# Patient Record
Sex: Female | Born: 1975 | Race: White | Hispanic: Yes | Marital: Married | Smoking: Former smoker
Health system: Southern US, Community
[De-identification: ages and names within clinical notes are randomized; demographics above are authoritative.]

## PROBLEM LIST (undated history)

## (undated) HISTORY — PX: APPENDECTOMY: SHX54

---

## 2000-11-20 ENCOUNTER — Emergency Department (HOSPITAL_COMMUNITY): Admission: EM | Admit: 2000-11-20 | Discharge: 2000-11-20 | Payer: Self-pay | Admitting: Emergency Medicine

## 2002-11-21 ENCOUNTER — Ambulatory Visit (HOSPITAL_COMMUNITY): Admission: RE | Admit: 2002-11-21 | Discharge: 2002-11-21 | Payer: Self-pay | Admitting: Obstetrics and Gynecology

## 2003-02-16 ENCOUNTER — Ambulatory Visit (HOSPITAL_COMMUNITY): Admission: AD | Admit: 2003-02-16 | Discharge: 2003-02-16 | Payer: Self-pay | Admitting: Obstetrics and Gynecology

## 2003-02-21 ENCOUNTER — Inpatient Hospital Stay (HOSPITAL_COMMUNITY): Admission: AD | Admit: 2003-02-21 | Discharge: 2003-02-22 | Payer: Self-pay | Admitting: Obstetrics and Gynecology

## 2005-03-11 ENCOUNTER — Emergency Department (HOSPITAL_COMMUNITY): Admission: EM | Admit: 2005-03-11 | Discharge: 2005-03-11 | Payer: Self-pay | Admitting: Emergency Medicine

## 2005-12-17 ENCOUNTER — Inpatient Hospital Stay (HOSPITAL_COMMUNITY): Admission: AD | Admit: 2005-12-17 | Discharge: 2005-12-19 | Payer: Self-pay | Admitting: Obstetrics and Gynecology

## 2012-09-25 ENCOUNTER — Encounter (HOSPITAL_COMMUNITY): Payer: Self-pay | Admitting: Emergency Medicine

## 2012-09-25 ENCOUNTER — Emergency Department (HOSPITAL_COMMUNITY)
Admission: EM | Admit: 2012-09-25 | Discharge: 2012-09-26 | Disposition: A | Discharge status: Home / Self Care | Payer: Self-pay | Attending: Emergency Medicine | Admitting: Emergency Medicine

## 2012-09-25 DIAGNOSIS — R42 Dizziness and giddiness: Secondary | ICD-10-CM | POA: Insufficient documentation

## 2012-09-25 DIAGNOSIS — R05 Cough: Secondary | ICD-10-CM | POA: Insufficient documentation

## 2012-09-25 DIAGNOSIS — R0602 Shortness of breath: Secondary | ICD-10-CM | POA: Insufficient documentation

## 2012-09-25 DIAGNOSIS — R059 Cough, unspecified: Secondary | ICD-10-CM | POA: Insufficient documentation

## 2012-09-25 DIAGNOSIS — Z87891 Personal history of nicotine dependence: Secondary | ICD-10-CM | POA: Insufficient documentation

## 2012-09-25 DIAGNOSIS — R5381 Other malaise: Secondary | ICD-10-CM | POA: Insufficient documentation

## 2012-09-25 NOTE — ED Notes (Signed)
Patient c/o productive cough with green phlegm x 1 month.  States has been feeling weak x 2 days.

## 2012-09-26 ENCOUNTER — Emergency Department (HOSPITAL_COMMUNITY): Payer: Self-pay

## 2012-09-26 MED ORDER — ALBUTEROL SULFATE HFA 108 (90 BASE) MCG/ACT IN AERS: 1.0000 | INHALATION_SPRAY | Freq: Four times a day (QID) | RESPIRATORY_TRACT | Status: DC | PRN

## 2012-09-26 MED ORDER — GUAIFENESIN-CODEINE 100-10 MG/5ML PO SOLN
5.0000 mL | Freq: Once | ORAL | Status: AC
  Administered 2012-09-26: 5 mL via ORAL
  Filled 2012-09-26: qty 5

## 2012-09-26 MED ORDER — ALBUTEROL SULFATE (5 MG/ML) 0.5% IN NEBU
5.0000 mg | INHALATION_SOLUTION | Freq: Once | RESPIRATORY_TRACT | Status: AC
  Administered 2012-09-26: 5 mg via RESPIRATORY_TRACT
  Filled 2012-09-26: qty 1

## 2012-09-26 MED ORDER — METHYLPREDNISOLONE SODIUM SUCC 125 MG IJ SOLR
125.0000 mg | Freq: Once | INTRAMUSCULAR | Status: AC
  Administered 2012-09-26: 125 mg via INTRAMUSCULAR
  Filled 2012-09-26: qty 2

## 2012-09-26 MED ORDER — DEXTROMETHORPHAN HBR 15 MG/5ML PO SYRP: 10.0000 mL | ORAL_SOLUTION | Freq: Four times a day (QID) | ORAL | Status: DC | PRN

## 2012-09-26 MED ORDER — PREDNISONE 10 MG PO TABS: 10.0000 mg | ORAL_TABLET | Freq: Every day | ORAL | Status: DC

## 2012-09-26 NOTE — ED Notes (Signed)
Pt complains of shortness of breath and cough over several months. States over the last couple days the cough has gotten worse, she has been weak and dizzy. Also states she has not been able to eat or drink much lately. Lungs clear on initial assessment. 02 sats 98% on room air.

## 2012-09-26 NOTE — ED Notes (Signed)
Pt states she feels much better, states decreased SOB after breathing treatment and states no cough at this time. Lungs clear, NAD.

## 2012-09-26 NOTE — ED Provider Notes (Signed)
History     CSN: [id number]  Arrival date & time 09/25/12  2328   First MD Initiated Contact with Patient 09/25/12 2347      Chief Complaint  Patient presents with  . Weakness  . Cough  . Dizziness    (Consider location/radiation/quality/duration/timing/severity/associated sxs/prior treatment) HPI Rita Charles is a 37 y.o. female who presents to the Emergency Department complaining of a cough for one month that will not allow her to sleep. She is sore from coughing.   History reviewed. No pertinent past medical history.  History reviewed. No pertinent past surgical history.  No family history on file.  History  Substance Use Topics  . Smoking status: Former Games developer  . Smokeless tobacco: Not on file  . Alcohol Use: Yes    OB History   Grav Para Term Preterm Abortions TAB SAB Ect Mult Living                  Review of Systems  Constitutional: Negative for fever.       10 Systems reviewed and are negative for acute change except as noted in the HPI.  HENT: Negative for congestion.   Eyes: Negative for discharge and redness.  Respiratory: Positive for cough and shortness of breath. Negative for wheezing.   Cardiovascular: Negative for chest pain.  Gastrointestinal: Negative for vomiting and abdominal pain.  Musculoskeletal: Negative for back pain.  Skin: Negative for rash.  Neurological: Negative for syncope, numbness and headaches.  Psychiatric/Behavioral:       No behavior change.    Allergies  Review of patient's allergies indicates no known allergies.  Home Medications  No current outpatient prescriptions on file.  BP 138/88  Pulse 68  Temp(Src) 98.3 F (36.8 C) (Oral)  Resp 20  Ht 5\' 9"  (1.753 m)  Wt 150 lb (68.04 kg)  BMI 22.14 kg/m2  SpO2 98%  LMP 08/28/2012  Physical Exam  Nursing note and vitals reviewed. Constitutional: She is oriented to person, place, and time.  Awake, alert, nontoxic appearance.  HENT:  Head: Normocephalic and  atraumatic.  Right Ear: External ear normal.  Left Ear: External ear normal.  Mouth/Throat: Oropharynx is clear and moist.  Eyes: Right eye exhibits no discharge. Left eye exhibits no discharge.  Neck: Neck supple.  Pulmonary/Chest: Effort normal. She has no wheezes. She has no rales. She exhibits no tenderness.  Coarse coughing  Abdominal: Soft. Bowel sounds are normal. There is no tenderness. There is no rebound.  Musculoskeletal: Normal range of motion. She exhibits no tenderness.  Baseline ROM, no obvious new focal weakness.  Neurological: She is alert and oriented to person, place, and time.  Mental status and motor strength appears baseline for patient and situation.  Skin: No rash noted.  Psychiatric: She has a normal mood and affect.    ED Course  Procedures (including critical care time)  Dg Chest 2 View  09/26/2012  *RADIOLOGY REPORT*  Clinical Data: 37 year old female with intermittent cough and congestion times 6 weeks.  Weakness and dizziness.  CHEST - 2 VIEW  Comparison: None.  Findings: Low lung volumes on the frontal view.  Cardiac size at the upper limits of normal. Other mediastinal contours are within normal limits.  Visualized tracheal air column is within normal limits.  No pneumothorax, pulmonary edema, pleural effusion or confluent pulmonary opacity.  Increased interstitial markings on the frontal view likely due to crowding/atelectasis, not evident on the lateral. No osseous abnormality identified.  IMPRESSION: No acute cardiopulmonary  abnormality.   Original Report Authenticated By: Erskine Speed, M.D.      MDM  Patient with cough which is persistent. Given albuterol. Solumedrol and robitussin ac with relief. Chest xray does not show an acute process.  Pt feels improved after observation and/or treatment in ED.Pt stable in ED with no significant deterioration in condition.The patient appears reasonably screened and/or stabilized for discharge and I doubt any other  medical condition or other [facility] requiring further screening, evaluation, or treatment in the ED at this time prior to discharge.  MDM Reviewed: nursing note and vitals Interpretation: x-ray           Nicoletta Dress. Colon Branch, MD 09/26/12 [phone]

## 2018-03-25 ENCOUNTER — Emergency Department (HOSPITAL_COMMUNITY): Payer: Self-pay

## 2018-03-25 ENCOUNTER — Observation Stay (HOSPITAL_COMMUNITY)
Admission: EM | Admit: 2018-03-25 | Discharge: 2018-03-25 | Disposition: A | Discharge status: Home / Self Care | Payer: Self-pay | Attending: General Surgery | Admitting: General Surgery

## 2018-03-25 ENCOUNTER — Encounter (HOSPITAL_COMMUNITY)
Admission: EM | Disposition: A | Discharge status: Home / Self Care | Payer: Self-pay | Source: Home / Self Care | Attending: Emergency Medicine

## 2018-03-25 ENCOUNTER — Observation Stay (HOSPITAL_COMMUNITY): Payer: Self-pay | Admitting: Anesthesiology

## 2018-03-25 ENCOUNTER — Encounter (HOSPITAL_COMMUNITY): Payer: Self-pay

## 2018-03-25 ENCOUNTER — Other Ambulatory Visit: Payer: Self-pay

## 2018-03-25 DIAGNOSIS — Z7952 Long term (current) use of systemic steroids: Secondary | ICD-10-CM | POA: Insufficient documentation

## 2018-03-25 DIAGNOSIS — Z87891 Personal history of nicotine dependence: Secondary | ICD-10-CM | POA: Insufficient documentation

## 2018-03-25 DIAGNOSIS — K3580 Unspecified acute appendicitis: Principal | ICD-10-CM | POA: Diagnosis present

## 2018-03-25 DIAGNOSIS — Z79899 Other long term (current) drug therapy: Secondary | ICD-10-CM | POA: Insufficient documentation

## 2018-03-25 HISTORY — PX: LAPAROSCOPIC APPENDECTOMY: SHX408

## 2018-03-25 LAB — CBC WITH DIFFERENTIAL/PLATELET
BASOS ABS: 0 10*3/uL (ref 0.0–0.1)
Basophils Relative: 0 %
EOS PCT: 0 %
Eosinophils Absolute: 0 10*3/uL (ref 0.0–0.7)
HEMATOCRIT: 38.2 % (ref 36.0–46.0)
Hemoglobin: 12.9 g/dL (ref 12.0–15.0)
LYMPHS PCT: 9 %
Lymphs Abs: 1.2 10*3/uL (ref 0.7–4.0)
MCH: 29.5 pg (ref 26.0–34.0)
MCHC: 33.8 g/dL (ref 30.0–36.0)
MCV: 87.2 fL (ref 78.0–100.0)
MONO ABS: 0.4 10*3/uL (ref 0.1–1.0)
MONOS PCT: 3 %
NEUTROS ABS: 12.4 10*3/uL — AB (ref 1.7–7.7)
Neutrophils Relative %: 88 %
PLATELETS: 286 10*3/uL (ref 150–400)
RBC: 4.38 MIL/uL (ref 3.87–5.11)
RDW: 12.1 % (ref 11.5–15.5)
WBC: 14.1 10*3/uL — ABNORMAL HIGH (ref 4.0–10.5)

## 2018-03-25 LAB — URINALYSIS, ROUTINE W REFLEX MICROSCOPIC
Bilirubin Urine: NEGATIVE
GLUCOSE, UA: NEGATIVE mg/dL
HGB URINE DIPSTICK: NEGATIVE
Ketones, ur: 20 mg/dL — AB
LEUKOCYTES UA: NEGATIVE
Nitrite: NEGATIVE
Protein, ur: NEGATIVE mg/dL
SPECIFIC GRAVITY, URINE: 1.004 — AB (ref 1.005–1.030)
pH: 8 (ref 5.0–8.0)

## 2018-03-25 LAB — COMPREHENSIVE METABOLIC PANEL
ALBUMIN: 4.5 g/dL (ref 3.5–5.0)
ALK PHOS: 68 U/L (ref 38–126)
ALT: 19 U/L (ref 0–44)
ANION GAP: 12 (ref 5–15)
AST: 18 U/L (ref 15–41)
BILIRUBIN TOTAL: 0.5 mg/dL (ref 0.3–1.2)
BUN: 7 mg/dL (ref 6–20)
CO2: 20 mmol/L — ABNORMAL LOW (ref 22–32)
Calcium: 9.4 mg/dL (ref 8.9–10.3)
Chloride: 104 mmol/L (ref 98–111)
Creatinine, Ser: 0.56 mg/dL (ref 0.44–1.00)
GFR calc Af Amer: >60 mL/min (ref >60)
GFR calc non Af Amer: >60 mL/min (ref >60)
GLUCOSE: 141 mg/dL — AB (ref 70–99)
POTASSIUM: 3.6 mmol/L (ref 3.5–5.1)
Sodium: 136 mmol/L (ref 135–145)
TOTAL PROTEIN: 7.7 g/dL (ref 6.5–8.1)

## 2018-03-25 LAB — SURGICAL PCR SCREEN
MRSA, PCR: NEGATIVE
STAPHYLOCOCCUS AUREUS: NEGATIVE

## 2018-03-25 LAB — I-STAT BETA HCG BLOOD, ED (MC, WL, AP ONLY)

## 2018-03-25 SURGERY — APPENDECTOMY, LAPAROSCOPIC
Anesthesia: General | Site: Abdomen

## 2018-03-25 MED ORDER — CHLORHEXIDINE GLUCONATE CLOTH 2 % EX PADS: 6.0000 | MEDICATED_PAD | Freq: Once | CUTANEOUS | Status: DC

## 2018-03-25 MED ORDER — SIMETHICONE 80 MG PO CHEW: 40.0000 mg | CHEWABLE_TABLET | Freq: Four times a day (QID) | ORAL | Status: DC | PRN

## 2018-03-25 MED ORDER — FENTANYL CITRATE (PF) 100 MCG/2ML IJ SOLN: 25.0000 ug | INTRAMUSCULAR | Status: DC | PRN

## 2018-03-25 MED ORDER — SEVOFLURANE IN SOLN
RESPIRATORY_TRACT | Status: AC
  Filled 2018-03-25: qty 250

## 2018-03-25 MED ORDER — ALBUTEROL SULFATE HFA 108 (90 BASE) MCG/ACT IN AERS: 1.0000 | INHALATION_SPRAY | Freq: Four times a day (QID) | RESPIRATORY_TRACT | Status: DC | PRN

## 2018-03-25 MED ORDER — CHLORHEXIDINE GLUCONATE CLOTH 2 % EX PADS
6.0000 | MEDICATED_PAD | Freq: Once | CUTANEOUS | Status: DC
  Administered 2018-03-25: 6 via TOPICAL

## 2018-03-25 MED ORDER — ONDANSETRON HCL 4 MG/2ML IJ SOLN
INTRAMUSCULAR | Status: AC
  Filled 2018-03-25: qty 2

## 2018-03-25 MED ORDER — PROPOFOL 10 MG/ML IV BOLUS
INTRAVENOUS | Status: AC
  Filled 2018-03-25: qty 20

## 2018-03-25 MED ORDER — ACETAMINOPHEN 325 MG PO TABS: 650.0000 mg | ORAL_TABLET | Freq: Four times a day (QID) | ORAL | Status: DC | PRN

## 2018-03-25 MED ORDER — HYDROCODONE-ACETAMINOPHEN 5-325 MG PO TABS: 1.0000 | ORAL_TABLET | ORAL | 0 refills | Status: DC | PRN

## 2018-03-25 MED ORDER — ONDANSETRON 4 MG PO TBDP: 4.0000 mg | ORAL_TABLET | Freq: Four times a day (QID) | ORAL | Status: DC | PRN

## 2018-03-25 MED ORDER — ONDANSETRON HCL 4 MG/2ML IJ SOLN
4.0000 mg | Freq: Once | INTRAMUSCULAR | Status: AC
  Administered 2018-03-25: 4 mg via INTRAVENOUS
  Filled 2018-03-25: qty 2

## 2018-03-25 MED ORDER — KETOROLAC TROMETHAMINE 30 MG/ML IJ SOLN: 30.0000 mg | Freq: Four times a day (QID) | INTRAMUSCULAR | Status: DC | PRN

## 2018-03-25 MED ORDER — FENTANYL CITRATE (PF) 250 MCG/5ML IJ SOLN
INTRAMUSCULAR | Status: AC
  Filled 2018-03-25: qty 5

## 2018-03-25 MED ORDER — POVIDONE-IODINE 10 % EX OINT
TOPICAL_OINTMENT | CUTANEOUS | Status: AC
  Filled 2018-03-25: qty 1

## 2018-03-25 MED ORDER — SODIUM CHLORIDE 0.9 % IV SOLN
1.0000 g | Freq: Once | INTRAVENOUS | Status: AC
  Administered 2018-03-25: 1 g via INTRAVENOUS
  Filled 2018-03-25: qty 10

## 2018-03-25 MED ORDER — BUPIVACAINE LIPOSOME 1.3 % IJ SUSP
INTRAMUSCULAR | Status: DC | PRN
  Administered 2018-03-25: 20 mL

## 2018-03-25 MED ORDER — MIDAZOLAM HCL 2 MG/2ML IJ SOLN
INTRAMUSCULAR | Status: AC
  Filled 2018-03-25: qty 2

## 2018-03-25 MED ORDER — FENTANYL CITRATE (PF) 100 MCG/2ML IJ SOLN
INTRAMUSCULAR | Status: DC | PRN
  Administered 2018-03-25 (×5): 50 ug via INTRAVENOUS

## 2018-03-25 MED ORDER — DEXAMETHASONE SODIUM PHOSPHATE 4 MG/ML IJ SOLN
INTRAMUSCULAR | Status: AC
  Filled 2018-03-25: qty 1

## 2018-03-25 MED ORDER — LACTATED RINGERS IV SOLN
INTRAVENOUS | Status: DC | PRN
  Administered 2018-03-25: 12:00:00 via INTRAVENOUS

## 2018-03-25 MED ORDER — LIDOCAINE 2% (20 MG/ML) 5 ML SYRINGE
INTRAMUSCULAR | Status: DC | PRN
  Administered 2018-03-25: 30 mg via INTRAVENOUS

## 2018-03-25 MED ORDER — ALBUTEROL SULFATE (2.5 MG/3ML) 0.083% IN NEBU: 2.5000 mg | INHALATION_SOLUTION | Freq: Four times a day (QID) | RESPIRATORY_TRACT | Status: DC | PRN

## 2018-03-25 MED ORDER — SODIUM CHLORIDE 0.9 % IV SOLN
INTRAVENOUS | Status: DC
  Administered 2018-03-25: 16:00:00 via INTRAVENOUS

## 2018-03-25 MED ORDER — SODIUM CHLORIDE 0.9 % IV SOLN
2.0000 g | INTRAVENOUS | Status: DC
  Filled 2018-03-25: qty 20

## 2018-03-25 MED ORDER — ROCURONIUM BROMIDE 50 MG/5ML IV SOLN
INTRAVENOUS | Status: AC
  Filled 2018-03-25: qty 2

## 2018-03-25 MED ORDER — ONDANSETRON HCL 4 MG/2ML IJ SOLN
INTRAMUSCULAR | Status: DC | PRN
  Administered 2018-03-25: 4 mg via INTRAVENOUS

## 2018-03-25 MED ORDER — HYDROMORPHONE HCL 1 MG/ML IJ SOLN
1.0000 mg | INTRAMUSCULAR | Status: DC | PRN
  Administered 2018-03-25: 1 mg via INTRAVENOUS
  Filled 2018-03-25: qty 1

## 2018-03-25 MED ORDER — METRONIDAZOLE IN NACL 5-0.79 MG/ML-% IV SOLN
500.0000 mg | Freq: Three times a day (TID) | INTRAVENOUS | Status: DC
  Administered 2018-03-25: 500 mg via INTRAVENOUS
  Filled 2018-03-25: qty 100

## 2018-03-25 MED ORDER — ROCURONIUM BROMIDE 50 MG/5ML IV SOSY
PREFILLED_SYRINGE | INTRAVENOUS | Status: DC | PRN
  Administered 2018-03-25: 25 mg via INTRAVENOUS

## 2018-03-25 MED ORDER — DEXAMETHASONE SODIUM PHOSPHATE 4 MG/ML IJ SOLN
INTRAMUSCULAR | Status: DC | PRN
  Administered 2018-03-25: 4 mg via INTRAVENOUS

## 2018-03-25 MED ORDER — KETOROLAC TROMETHAMINE 30 MG/ML IJ SOLN
30.0000 mg | Freq: Once | INTRAMUSCULAR | Status: AC
  Administered 2018-03-25: 30 mg via INTRAVENOUS
  Filled 2018-03-25: qty 1

## 2018-03-25 MED ORDER — BUPIVACAINE LIPOSOME 1.3 % IJ SUSP
INTRAMUSCULAR | Status: AC
  Filled 2018-03-25: qty 20

## 2018-03-25 MED ORDER — SUGAMMADEX SODIUM 500 MG/5ML IV SOLN
INTRAVENOUS | Status: DC | PRN
  Administered 2018-03-25: 154.2 mg via INTRAVENOUS

## 2018-03-25 MED ORDER — FENTANYL CITRATE (PF) 100 MCG/2ML IJ SOLN
INTRAMUSCULAR | Status: AC
  Filled 2018-03-25: qty 2

## 2018-03-25 MED ORDER — ENOXAPARIN SODIUM 40 MG/0.4ML ~~LOC~~ SOLN: 40.0000 mg | SUBCUTANEOUS | Status: DC

## 2018-03-25 MED ORDER — MORPHINE SULFATE (PF) 4 MG/ML IV SOLN
4.0000 mg | Freq: Once | INTRAVENOUS | Status: AC
  Administered 2018-03-25: 4 mg via INTRAVENOUS
  Filled 2018-03-25: qty 1

## 2018-03-25 MED ORDER — ONDANSETRON HCL 4 MG/2ML IJ SOLN: 4.0000 mg | Freq: Four times a day (QID) | INTRAMUSCULAR | Status: DC | PRN

## 2018-03-25 MED ORDER — SODIUM CHLORIDE 0.9 % IR SOLN
Status: DC | PRN
  Administered 2018-03-25: 1000 mL

## 2018-03-25 MED ORDER — ACETAMINOPHEN 650 MG RE SUPP: 650.0000 mg | Freq: Four times a day (QID) | RECTAL | Status: DC | PRN

## 2018-03-25 MED ORDER — KETOROLAC TROMETHAMINE 30 MG/ML IJ SOLN
30.0000 mg | Freq: Four times a day (QID) | INTRAMUSCULAR | Status: AC
  Administered 2018-03-25: 30 mg via INTRAVENOUS
  Filled 2018-03-25: qty 1

## 2018-03-25 MED ORDER — LACTATED RINGERS IV SOLN: INTRAVENOUS | Status: DC

## 2018-03-25 MED ORDER — DIPHENHYDRAMINE HCL 50 MG/ML IJ SOLN: 25.0000 mg | Freq: Four times a day (QID) | INTRAMUSCULAR | Status: DC | PRN

## 2018-03-25 MED ORDER — POVIDONE-IODINE 10 % OINT PACKET
TOPICAL_OINTMENT | CUTANEOUS | Status: DC | PRN
  Administered 2018-03-25: 1 via TOPICAL

## 2018-03-25 MED ORDER — LIDOCAINE HCL (PF) 1 % IJ SOLN
INTRAMUSCULAR | Status: AC
  Filled 2018-03-25: qty 5

## 2018-03-25 MED ORDER — HYDROCODONE-ACETAMINOPHEN 7.5-325 MG PO TABS: 1.0000 | ORAL_TABLET | Freq: Once | ORAL | Status: DC | PRN

## 2018-03-25 MED ORDER — MIDAZOLAM HCL 5 MG/5ML IJ SOLN
INTRAMUSCULAR | Status: DC | PRN
  Administered 2018-03-25: 2 mg via INTRAVENOUS

## 2018-03-25 MED ORDER — HYDROCODONE-ACETAMINOPHEN 5-325 MG PO TABS: 1.0000 | ORAL_TABLET | ORAL | Status: DC | PRN

## 2018-03-25 MED ORDER — SUGAMMADEX SODIUM 200 MG/2ML IV SOLN
INTRAVENOUS | Status: AC
  Filled 2018-03-25: qty 2

## 2018-03-25 MED ORDER — METRONIDAZOLE IN NACL 5-0.79 MG/ML-% IV SOLN
500.0000 mg | Freq: Once | INTRAVENOUS | Status: AC
  Administered 2018-03-25: 500 mg via INTRAVENOUS
  Filled 2018-03-25: qty 100

## 2018-03-25 MED ORDER — SUCCINYLCHOLINE CHLORIDE 20 MG/ML IJ SOLN
INTRAMUSCULAR | Status: DC | PRN
  Administered 2018-03-25: 120 mg via INTRAVENOUS

## 2018-03-25 MED ORDER — DIPHENHYDRAMINE HCL 25 MG PO CAPS: 25.0000 mg | ORAL_CAPSULE | Freq: Four times a day (QID) | ORAL | Status: DC | PRN

## 2018-03-25 MED ORDER — FENTANYL CITRATE (PF) 100 MCG/2ML IJ SOLN
50.0000 ug | Freq: Once | INTRAMUSCULAR | Status: AC
  Administered 2018-03-25: 50 ug via INTRAVENOUS

## 2018-03-25 SURGICAL SUPPLY — 48 items
BAG RETRIEVAL 10 (BASKET) ×1
BAG RETRIEVAL 10MM (BASKET) ×1
CHLORAPREP W/TINT 26ML (MISCELLANEOUS) ×3 IMPLANT
CLOTH BEACON ORANGE TIMEOUT ST (SAFETY) ×3 IMPLANT
COVER LIGHT HANDLE STERIS (MISCELLANEOUS) ×6 IMPLANT
CUTTER FLEX LINEAR 45M (STAPLE) ×3 IMPLANT
DECANTER SPIKE VIAL GLASS SM (MISCELLANEOUS) ×3 IMPLANT
ELECT REM PT RETURN 9FT ADLT (ELECTROSURGICAL) ×3
ELECTRODE REM PT RTRN 9FT ADLT (ELECTROSURGICAL) ×1 IMPLANT
EVACUATOR SMOKE 8.L (FILTER) ×3 IMPLANT
GLOVE BIOGEL PI IND STRL 7.0 (GLOVE) ×2 IMPLANT
GLOVE BIOGEL PI INDICATOR 7.0 (GLOVE) ×4
GLOVE SURG SS PI 7.5 STRL IVOR (GLOVE) ×3 IMPLANT
GOWN STRL REUS W/ TWL XL LVL3 (GOWN DISPOSABLE) ×1 IMPLANT
GOWN STRL REUS W/TWL LRG LVL3 (GOWN DISPOSABLE) ×3 IMPLANT
GOWN STRL REUS W/TWL XL LVL3 (GOWN DISPOSABLE) ×2
INST SET LAPROSCOPIC AP (KITS) ×3 IMPLANT
IV NS IRRIG 3000ML ARTHROMATIC (IV SOLUTION) IMPLANT
KIT TURNOVER KIT A (KITS) ×3 IMPLANT
MANIFOLD NEPTUNE II (INSTRUMENTS) ×3 IMPLANT
NEEDLE HYPO 18GX1.5 BLUNT FILL (NEEDLE) ×3 IMPLANT
NEEDLE HYPO 22GX1.5 SAFETY (NEEDLE) ×3 IMPLANT
NEEDLE INSUFFLATION 14GA 120MM (NEEDLE) ×3 IMPLANT
NS IRRIG 1000ML POUR BTL (IV SOLUTION) ×3 IMPLANT
PACK LAP CHOLE LZT030E (CUSTOM PROCEDURE TRAY) ×3 IMPLANT
PAD ARMBOARD 7.5X6 YLW CONV (MISCELLANEOUS) ×3 IMPLANT
PENCIL HANDSWITCHING (ELECTRODE) ×3 IMPLANT
RELOAD 45 VASCULAR/THIN (ENDOMECHANICALS) IMPLANT
RELOAD STAPLE TA45 3.5 REG BLU (ENDOMECHANICALS) ×3 IMPLANT
SET BASIN LINEN APH (SET/KITS/TRAYS/PACK) ×3 IMPLANT
SET TUBE IRRIG SUCTION NO TIP (IRRIGATION / IRRIGATOR) IMPLANT
SHEARS HARMONIC ACE PLUS 36CM (ENDOMECHANICALS) ×3 IMPLANT
SPONGE GAUZE 2X2 8PLY STER LF (GAUZE/BANDAGES/DRESSINGS) ×1
SPONGE GAUZE 2X2 8PLY STRL LF (GAUZE/BANDAGES/DRESSINGS) ×2 IMPLANT
STAPLER VISISTAT (STAPLE) ×3 IMPLANT
SUT VICRYL 0 UR6 27IN ABS (SUTURE) ×3 IMPLANT
SYR 20CC LL (SYRINGE) ×6 IMPLANT
SYS BAG RETRIEVAL 10MM (BASKET) ×1
SYSTEM BAG RETRIEVAL 10MM (BASKET) ×1 IMPLANT
TAPE PAPER 2X10 WHT MICROPORE (GAUZE/BANDAGES/DRESSINGS) ×3 IMPLANT
TRAY FOLEY W/BAG SLVR 16FR (SET/KITS/TRAYS/PACK) ×2
TRAY FOLEY W/BAG SLVR 16FR ST (SET/KITS/TRAYS/PACK) ×1 IMPLANT
TROCAR ENDO BLADELESS 11MM (ENDOMECHANICALS) ×3 IMPLANT
TROCAR ENDO BLADELESS 12MM (ENDOMECHANICALS) ×3 IMPLANT
TROCAR XCEL NON-BLD 5MMX100MML (ENDOMECHANICALS) ×3 IMPLANT
TUBING INSUFFLATION (TUBING) ×3 IMPLANT
WARMER LAPAROSCOPE (MISCELLANEOUS) ×3 IMPLANT
YANKAUER SUCT 12FT TUBE ARGYLE (SUCTIONS) ×3 IMPLANT

## 2018-03-25 NOTE — ED Triage Notes (Signed)
Pt reports generalized abd pain, sharp in nature that started Wednesday morning, worse since then.  Pt vomited x 1

## 2018-03-25 NOTE — Anesthesia Procedure Notes (Signed)
Procedure Name: Intubation Date/Time: 03/25/2018 12:45 PM Performed by: Andree Elk, Adamae Ricklefs A, CRNA Pre-anesthesia Checklist: Patient identified, Patient being monitored, Timeout performed, Emergency Drugs available and Suction available Patient Re-evaluated:Patient Re-evaluated prior to induction Oxygen Delivery Method: Circle system utilized Preoxygenation: Pre-oxygenation with 100% oxygen Induction Type: IV induction, Rapid sequence and Cricoid Pressure applied Laryngoscope Size: Mac and 3 Grade View: Grade I Tube type: Oral Tube size: 7.0 mm Number of attempts: 1 Airway Equipment and Method: Stylet Placement Confirmation: ETT inserted through vocal cords under direct vision,  positive ETCO2 and breath sounds checked- equal and bilateral Secured at: 21 cm Tube secured with: Tape Dental Injury: Teeth and Oropharynx as per pre-operative assessment

## 2018-03-25 NOTE — Discharge Summary (Signed)
Physician Discharge Summary  Patient ID: Rita Charles MRN: [id number] DOB/AGE: 11/29/1975 42 y.o.  Admit date: 03/25/2018 Discharge date: 03/25/2018  Admission Diagnoses: Acute appendicitis  Discharge Diagnoses: Same Active Problems:   Acute appendicitis   Discharged Condition: good  Hospital Course: Patient is a 42 year old Hispanic female who presented to the emergency room on 03/25/2018 with a less than 24-hour history of worsening right lower quadrant abdominal pain.  CT scan of the abdomen revealed acute appendicitis.  Patient was taken to the operating room on the same day and underwent a laparoscopic appendectomy.  She tolerated procedure well.  Her postop was course was unremarkable.  Her diet was advanced at difficulty.  The patient is being discharged home on 03/25/2018 in good and improving condition.  Treatments: surgery: Laparoscopic appendectomy on 03/25/2018  Discharge Exam: Blood pressure 117/83, pulse 66, temperature 97.7 F (36.5 C), resp. rate 16, height 5\' 8"  (1.727 m), weight 77.1 kg, last menstrual period 03/08/2018, SpO2 99 %. General appearance: alert, cooperative and no distress Resp: clear to auscultation bilaterally Cardio: regular rate and rhythm, S1, S2 normal, no murmur, click, rub or gallop GI: Soft, dressing is dry and intact.  Disposition: Discharge disposition: 01-Home or Self Care       Discharge Instructions    Diet - low sodium heart healthy   Complete by:  As directed    Increase activity slowly   Complete by:  As directed      Allergies as of 03/25/2018   No Known Allergies     Medication List    STOP taking these medications   predniSONE 10 MG tablet Commonly known as:  DELTASONE     TAKE these medications   albuterol 108 (90 Base) MCG/ACT inhaler Commonly known as:  PROVENTIL HFA;VENTOLIN HFA Inhale 1-2 puffs into the lungs every 6 (six) hours as needed for wheezing.   HYDROcodone-acetaminophen 5-325 MG tablet Commonly  known as:  NORCO/VICODIN Take 1-2 tablets by mouth every 4 (four) hours as needed for moderate pain.      Follow-up Information    Franky MachoJenkins, Lillyian Heidt, MD. Schedule an appointment as soon as possible for a visit on 04/01/2018.   Specialty:  General Surgery Contact information: [address] [place] [id number] [phone]           Signed: Franky MachoMark Lillah Standre 03/25/2018, 7:20 PM

## 2018-03-25 NOTE — Transfer of Care (Signed)
Immediate Anesthesia Transfer of Care Note  Patient: Rita RankinLilia G Blaker  Procedure(s) Performed: APPENDECTOMY LAPAROSCOPIC (N/A Abdomen)  Patient Location: PACU  Anesthesia Type:General  Level of Consciousness: awake, alert , oriented and patient cooperative  Airway & Oxygen Therapy: Patient Spontanous Breathing  Post-op Assessment: Report given to RN and Post -op Vital signs reviewed and stable  Post vital signs: Reviewed and stable  Last Vitals:  Vitals Value Taken Time  BP 94/69 03/25/2018  1:38 PM  Temp 37.1 C 03/25/2018  1:38 PM  Pulse 76 03/25/2018  1:42 PM  Resp 18 03/25/2018  1:41 PM  SpO2 100 % 03/25/2018  1:42 PM  Vitals shown include unvalidated device data.  Last Pain:  Vitals:   03/25/18 1222  TempSrc:   PainSc: 0-No pain      Patients Stated Pain Goal: 5 (03/25/18 1141)  Complications: No apparent anesthesia complications

## 2018-03-25 NOTE — Anesthesia Preprocedure Evaluation (Signed)
Anesthesia Evaluation  Patient identified by MRN, date of birth, ID band Patient awake    Reviewed: Allergy & Precautions, NPO status , Patient's Chart, lab work & pertinent test results  Airway Mallampati: I  TM Distance: >3 FB Neck ROM: Full    Dental no notable dental hx. (+) Teeth Intact   Pulmonary neg pulmonary ROS, former smoker,    Pulmonary exam normal breath sounds clear to auscultation       Cardiovascular Exercise Tolerance: Good negative cardio ROS Normal cardiovascular examI Rhythm:Regular Rate:Normal     Neuro/Psych negative neurological ROS  negative psych ROS   GI/Hepatic negative GI ROS, Neg liver ROS,   Endo/Other  negative endocrine ROS  Renal/GU negative Renal ROS  negative genitourinary   Musculoskeletal negative musculoskeletal ROS (+)   Abdominal   Peds negative pediatric ROS (+)  Hematology negative hematology ROS (+)   Anesthesia Other Findings   Reproductive/Obstetrics negative OB ROS                             Anesthesia Physical Anesthesia Plan  ASA: I and emergent  Anesthesia Plan: General   Post-op Pain Management:    Induction: Intravenous  PONV Risk Score and Plan:   Airway Management Planned: Oral ETT  Additional Equipment:   Intra-op Plan:   Post-operative Plan: Extubation in OR  Informed Consent: I have reviewed the patients History and Physical, chart, labs and discussed the procedure including the risks, benefits and alternatives for the proposed anesthesia with the patient or authorized representative who has indicated his/her understanding and acceptance.   Dental advisory given  Plan Discussed with: CRNA  Anesthesia Plan Comments:         Anesthesia Quick Evaluation

## 2018-03-25 NOTE — H&P (Signed)
Rita Charles is an 42 y.o. female.   Chief Complaint: Right lower quadrant abdominal pain HPI: Patient is a 42 year old Hispanic female who presented to the emergency room with a less than 24-hour history of worsening right lower quadrant abdominal pain.  CT scan of the abdomen reveals acute appendicitis.  Patient has a decreased appetite.  She denies any fever or chills.  She denies any diarrhea or constipation.  History reviewed. No pertinent past medical history.  History reviewed. No pertinent surgical history.  No family history on file. Social History:  reports that she has quit smoking. She has never used smokeless tobacco. She reports that she drinks alcohol. She reports that she does not use drugs.  Allergies: No Known Allergies   (Not in a hospital admission)  Results for orders placed or performed during the hospital encounter of 03/25/18 (from the past 48 hour(s))  Comprehensive metabolic panel     Status: Abnormal   Collection Time: 03/25/18  5:10 AM  Result Value Ref Range   Sodium 136 135 - 145 mmol/L   Potassium 3.6 3.5 - 5.1 mmol/L   Chloride 104 98 - 111 mmol/L   CO2 20 (L) 22 - 32 mmol/L   Glucose, Bld 141 (H) 70 - 99 mg/dL   BUN 7 6 - 20 mg/dL   Creatinine, Ser 0.56 0.44 - 1.00 mg/dL   Calcium 9.4 8.9 - 10.3 mg/dL   Total Protein 7.7 6.5 - 8.1 g/dL   Albumin 4.5 3.5 - 5.0 g/dL   AST 18 15 - 41 U/L   ALT 19 0 - 44 U/L   Alkaline Phosphatase 68 38 - 126 U/L   Total Bilirubin 0.5 0.3 - 1.2 mg/dL   GFR calc non Af Amer >60 >60 mL/min   GFR calc Af Amer >60 >60 mL/min    Comment: (NOTE) The eGFR has been calculated using the CKD EPI equation. This calculation has not been validated in all clinical situations. eGFR's persistently <60 mL/min signify possible Chronic Kidney Disease.    Anion gap 12 5 - 15    Comment: Performed at [facility], [address] [place], [place] [address]  CBC with Differential     Status: Abnormal   Collection Time: 03/25/18   5:10 AM  Result Value Ref Range   WBC 14.1 (H) 4.0 - 10.5 K/uL   RBC 4.38 3.87 - 5.11 MIL/uL   Hemoglobin 12.9 12.0 - 15.0 g/dL   HCT 38.2 36.0 - 46.0 %   MCV 87.2 78.0 - 100.0 fL   MCH 29.5 26.0 - 34.0 pg   MCHC 33.8 30.0 - 36.0 g/dL   RDW 12.1 11.5 - 15.5 %   Platelets 286 150 - 400 K/uL   Neutrophils Relative % 88 %   Neutro Abs 12.4 (H) 1.7 - 7.7 K/uL   Lymphocytes Relative 9 %   Lymphs Abs 1.2 0.7 - 4.0 K/uL   Monocytes Relative 3 %   Monocytes Absolute 0.4 0.1 - 1.0 K/uL   Eosinophils Relative 0 %   Eosinophils Absolute 0.0 0.0 - 0.7 K/uL   Basophils Relative 0 %   Basophils Absolute 0.0 0.0 - 0.1 K/uL    Comment: Performed at [facility], [address], [place], [place] [address]  Urinalysis, Routine w reflex microscopic     Status: Abnormal   Collection Time: 03/25/18  5:14 AM  Result Value Ref Range   Color, Urine STRAW (A) YELLOW   APPearance CLEAR CLEAR   Specific  Gravity, Urine 1.004 (L) 1.005 - 1.030   pH 8.0 5.0 - 8.0   Glucose, UA NEGATIVE NEGATIVE mg/dL   Hgb urine dipstick NEGATIVE NEGATIVE   Bilirubin Urine NEGATIVE NEGATIVE   Ketones, ur 20 (A) NEGATIVE mg/dL   Protein, ur NEGATIVE NEGATIVE mg/dL   Nitrite NEGATIVE NEGATIVE   Leukocytes, UA NEGATIVE NEGATIVE    Comment: Performed at [facility], [address] [place], [place] [address]  I-Stat beta hCG blood, ED     Status: None   Collection Time: 03/25/18  5:23 AM  Result Value Ref Range   I-stat hCG, quantitative <5.0 <5 mIU/mL   Comment 3            Comment:   GEST. AGE      CONC.  (mIU/mL)   <=1 WEEK        5 - 50     2 WEEKS       50 - 500     3 WEEKS       100 - 10,000     4 WEEKS     1,000 - 30,000        FEMALE AND NON-PREGNANT FEMALE:     LESS THAN 5 mIU/mL    Ct Renal Stone Study  Result Date: 03/25/2018 CLINICAL DATA:  42 year old female with flank pain. Concern for kidney stone. EXAM: CT ABDOMEN AND PELVIS WITHOUT CONTRAST TECHNIQUE: Multidetector CT imaging of the abdomen and  pelvis was performed following the standard protocol without IV contrast. COMPARISON:  None. FINDINGS: Evaluation of this exam is limited in the absence of intravenous contrast. Lower chest: The visualized lung bases are clear. No intra-abdominal free air or free fluid. Hepatobiliary: There is mild diffuse fatty infiltration of the liver. No intrahepatic biliary duct dilatation. The gallbladder is unremarkable. Pancreas: Unremarkable. No pancreatic ductal dilatation or surrounding inflammatory changes. Spleen: Normal in size without focal abnormality. Adrenals/Urinary Tract: The adrenal glands are unremarkable. There is no hydronephrosis or nephrolithiasis on either side. The visualized ureters and urinary bladder appear unremarkable. Stomach/Bowel: There is no bowel obstruction. The appendix is enlarged and inflamed measuring up to 14 mm in thickness. The appendix is located in the lower abdomen medial to the cecum. No evidence of perforation or abscess. Vascular/Lymphatic: The abdominal aorta and IVC are grossly unremarkable on this noncontrast CT. No portal venous gas. There is no adenopathy. Reproductive: The uterus is anteverted. The ovaries are grossly unremarkable. Other: None Musculoskeletal: No acute or significant osseous findings. IMPRESSION: 1. Acute appendicitis.  No abscess or perforation. 2. No hydronephrosis or nephrolithiasis. Electronically Signed   By: Anner Crete M.D.   On: 03/25/2018 06:06    Review of Systems  Constitutional: Positive for malaise/fatigue.  HENT: Negative.   Eyes: Negative.   Respiratory: Negative.   Cardiovascular: Negative.   Gastrointestinal: Negative.   Genitourinary: Negative.   Musculoskeletal: Negative.   Skin: Negative.   Neurological: Negative.   Endo/Heme/Allergies: Negative.   Psychiatric/Behavioral: Negative.     Blood pressure 128/80, pulse 71, temperature 98 F (36.7 C), temperature source Oral, resp. rate (!) 24, height '5\' 8"'$  (1.727 m),  weight 77.1 kg, last menstrual period 03/08/2018, SpO2 95 %. Physical Exam  Vitals reviewed. Constitutional: She is oriented to person, place, and time. She appears well-developed and well-nourished. No distress.  HENT:  Head: Normocephalic and atraumatic.  Cardiovascular: Normal rate, regular rhythm and normal heart sounds. Exam reveals no gallop and no friction rub.  No murmur heard. Respiratory: Effort  normal and breath sounds normal. No respiratory distress. She has no wheezes. She has no rales.  GI: Soft. Bowel sounds are normal. She exhibits no distension. There is tenderness. There is guarding. There is no rebound.  Neurological: She is alert and oriented to person, place, and time.  Skin: Skin is warm and dry.  CT scan report reviewed  Assessment/Plan Impression: Acute appendicitis Plan: Patient be taken to the operating room for laparoscopic appendectomy.  The risks and benefits of the procedure including bleeding, infection, and the possibility of an open procedure were fully explained to the patient, who gave informed consent.  Aviva Signs, MD 03/25/2018, 7:40 AM

## 2018-03-25 NOTE — Discharge Instructions (Signed)
Laparoscopic Appendectomy, Adult, Care After °Refer to this sheet in the next few weeks. These instructions provide you with information about caring for yourself after your procedure. Your health care provider may also give you more specific instructions. Your treatment has been planned according to current medical practices, but problems sometimes occur. Call your health care provider if you have any problems or questions after your procedure. °What can I expect after the procedure? °After the procedure, it is common to have: °· A decrease in your energy level. °· Mild pain in the area where the surgical cuts (incisions) were made. °· Constipation. This can be caused by pain medicine and a decrease in your activity. ° °Follow these instructions at home: °Medicines °· Take over-the-counter and prescription medicines only as told by your health care provider. °· Do not drive for 24 hours if you received a sedative. °· Do not drive or operate heavy machinery while taking prescription pain medicine. °· If you were prescribed an antibiotic medicine, take it as told by your health care provider. Do not stop taking the antibiotic even if you start to feel better. °Activity °· For 3 weeks or as long as told by your health care provider: °? Do not lift anything that is heavier than 10 pounds (4.5 kg). °? Do not play contact sports. °· Gradually return to your normal activities. Ask your health care provider what activities are safe for you. °Bathing °· Keep your incisions clean and dry. Clean them as often as told by your health care provider: °? Gently wash the incisions with soap and water. °? Rinse the incisions with water to remove all soap. °? Pat the incisions dry with a clean towel. Do not rub the incisions. °· You may take showers after 48 hours. °· Do not take baths, swim, or use hot tubs for 2 weeks or as told by your health care provider. °Incision care °· Follow instructions from your healthcare provider about  how to take care of your incisions. Make sure you: °? Wash your hands with soap and water before you change your bandage (dressing). If soap and water are not available, use hand sanitizer. °? Change your dressing as told by your health care provider. °? Leave stitches (sutures), skin glue, or adhesive strips in place. These skin closures may need to stay in place for 2 weeks or longer. If adhesive strip edges start to loosen and curl up, you may trim the loose edges. Do not remove adhesive strips completely unless your health care provider tells you to do that. °· Check your incision areas every day for signs of infection. Check for: °? More redness, swelling, or pain. °? More fluid or blood. °? Warmth. °? Pus or a bad smell. °Other Instructions °· If you were sent home with a drain, follow instructions from your health care provider about how to care for the drain and how to empty it. °· Take deep breaths. This helps to prevent your lungs from becoming inflamed. °· To relieve and prevent constipation: °? Drink plenty of fluids. °? Eat plenty of fruits and vegetables. °· Keep all follow-up visits as told by your health care provider. This is important. °Contact a health care provider if: °· You have more redness, swelling, or pain around an incision. °· You have more fluid or blood coming from an incision. °· Your incision feels warm to the touch. °· You have pus or a bad smell coming from an incision or dressing. °· Your incision   edges break open after your sutures have been removed. °· You have increasing pain in your shoulders. °· You feel dizzy or you faint. °· You develop shortness of breath. °· You keep feeling nauseous or vomiting. °· You have diarrhea or you cannot control your bowel functions. °· You lose your appetite. °· You develop swelling or pain in your legs. °Get help right away if: °· You have a fever. °· You develop a rash. °· You have difficulty breathing. °· You have sharp pains in your  chest. °This information is not intended to replace advice given to you by your health care provider. Make sure you discuss any questions you have with your health care provider. °Document Released: 07/14/2005 Document Revised: 12/14/2015 Document Reviewed: 01/01/2015 °[employer] Interactive Patient Education © 2018 [employer] ° °

## 2018-03-25 NOTE — ED Provider Notes (Signed)
[facility] EMERGENCY DEPARTMENT Provider Note   CSN: [id number] Arrival date & time: 03/25/18  0454     History   Chief Complaint Chief Complaint  Patient presents with  . Abdominal Pain    HPI Rita Charles is a 42 y.o. female.  Patient is a 42 year old female with no significant past medical history.  She presents today with complaints of right sided abdominal pain.  This started yesterday morning and has worsened throughout the day.  It became severe this morning and she presents for evaluation of it.  She denies any bowel or bladder complaints.  She denies any fevers or chills.  Her pain is in the right lower quadrant and radiates into the right flank and is severe.  She cannot find a comfortable position.  The history is provided by the patient.  Flank Pain  This is a new problem. The current episode started yesterday. The problem occurs constantly. The problem has been rapidly worsening. Associated symptoms include abdominal pain. Nothing aggravates the symptoms. Nothing relieves the symptoms.    History reviewed. No pertinent past medical history.  There are no active problems to display for this patient.   History reviewed. No pertinent surgical history.   OB History   None      Home Medications    Prior to Admission medications   Medication Sig Start Date End Date Taking? Authorizing Provider  albuterol (PROVENTIL HFA;VENTOLIN HFA) 108 (90 BASE) MCG/ACT inhaler Inhale 1-2 puffs into the lungs every 6 (six) hours as needed for wheezing. 09/26/12   Annamarie DawleyStrand, Terry S, MD  dextromethorphan 15 MG/5ML syrup Take 10 mLs (30 mg total) by mouth 4 (four) times daily as needed for cough. 09/26/12   Annamarie DawleyStrand, Terry S, MD  predniSONE (DELTASONE) 10 MG tablet Take 1 tablet (10 mg total) by mouth daily. 09/26/12   Annamarie DawleyStrand, Terry S, MD    Family History No family history on file.  Social History Social History   Tobacco Use  . Smoking status: Former Games developermoker  . Smokeless  tobacco: Never Used  Substance Use Topics  . Alcohol use: Yes  . Drug use: No     Allergies   Patient has no known allergies.   Review of Systems Review of Systems  Gastrointestinal: Positive for abdominal pain.  Genitourinary: Positive for flank pain.  All other systems reviewed and are negative.    Physical Exam Updated Vital Signs BP 136/82 (BP Location: Left Arm)   Pulse 84   Temp 98 F (36.7 C) (Oral)   Resp (!) 24   Ht 5\' 8"  (1.727 m)   Wt 77.1 kg   LMP 03/08/2018   SpO2 100%   BMI 25.85 kg/m   Physical Exam  Constitutional: She is oriented to person, place, and time. She appears well-developed and well-nourished. No distress.  Patient appears very uncomfortable and is moaning.  HENT:  Head: Normocephalic and atraumatic.  Neck: Normal range of motion. Neck supple.  Cardiovascular: Normal rate and regular rhythm. Exam reveals no gallop and no friction rub.  No murmur heard. Pulmonary/Chest: Effort normal and breath sounds normal. No respiratory distress. She has no wheezes.  Abdominal: Soft. Bowel sounds are normal. She exhibits no distension. There is tenderness in the right lower quadrant. There is CVA tenderness.  There is tenderness to palpation in the right lower quadrant and right flank.  Musculoskeletal: Normal range of motion.  Neurological: She is alert and oriented to person, place, and time.  Skin: Skin is  warm and dry. She is not diaphoretic.  Nursing note and vitals reviewed.    ED Treatments / Results  Labs (all labs ordered are listed, but only abnormal results are displayed) Labs Reviewed  URINALYSIS, ROUTINE W REFLEX MICROSCOPIC  COMPREHENSIVE METABOLIC PANEL  CBC WITH DIFFERENTIAL/PLATELET  I-STAT BETA HCG BLOOD, ED ([facility], [facility], [facility] ONLY)    EKG None  Radiology No results found.  Procedures Procedures (including critical care time)  Medications Ordered in ED Medications  ondansetron (ZOFRAN) injection 4 mg (has no  administration in time range)  ketorolac (TORADOL) 30 MG/ML injection 30 mg (has no administration in time range)  morphine 4 MG/ML injection 4 mg (has no administration in time range)     Initial Impression / Assessment and Plan / ED Course  I have reviewed the triage vital signs and the nursing notes.  Pertinent labs & imaging results that were available during my care of the patient were reviewed by me and considered in my medical decision making (see chart for details).  Patient presents with right-sided abdominal pain that started yesterday morning.  It became severe this morning.  Work-up reveals a white count of 14,000 and CT scan is consistent with acute appendicitis.  This has been discussed with Dr. Lovell Sheehan from general surgery.  He will see the patient in the ER.  Rocephin and Flagyl given.  Final Clinical Impressions(s) / ED Diagnoses   Final diagnoses:  None    ED Discharge Orders    None       Geoffery Lyons, MD 03/25/18 [phone]

## 2018-03-25 NOTE — Progress Notes (Signed)
Discharge instructions reviewed with patient and husband.  Both verbalized understanding of instructions.  Patient discharged home with family in stable condition.

## 2018-03-25 NOTE — Op Note (Signed)
Patient:  Rita Charles  DOB:  11-Mar-1976  MRN:  [id number]   Preop Diagnosis: Acute appendicitis  Postop Diagnosis: Same  Procedure: Laparoscopic appendectomy  Surgeon: Franky MachoMark Casmira Cramer, MD  Anes: General tracheal  Indications: Patient is a 42 year old Hispanic female who presents with a less than 24-hour history of worsening right lower quadrant abdominal pain.  CT scan of the abdomen reveals acute appendicitis.  The risks and benefits of the procedure including bleeding, infection, and the possibility of an open procedure were fully explained to the patient, who gave informed consent.  Procedure note: The patient was placed in the supine position.  After induction of general endotracheal anesthesia, the abdomen was prepped and draped using the usual sterile technique with DuraPrep.  Surgical site confirmation was performed.  A supraumbilical incision was made down to the fascia.  A Veress needle was introduced into the abdominal cavity and confirmation of placement was done using the saline drop test.  The abdomen was then insufflated to 16 mmHg of pressure.  An 11 mm trocar was introduced into the abdominal cavity under direct visualization without difficulty.  The patient was placed in deeper Trendelenburg position and an additional 12 mm trocar was placed in suprapubic region 5 mm trocar was placed in left lower quadrant region.  The appendix was visualized and noted to be acutely inflamed.  The mesoappendix was divided using the harmonic scalpel.  The juncture of the appendix to the cecum was fully visualized.  A standard Endo GIA was placed across this juncture and fired.  The staple line was inspected and noted to be within normal limits.  The appendix was removed using an Endo Catch bag without difficulty.  All fluid and air were then evacuated from the abdominal cavity prior to the removal of the trochars.  All wounds were irrigated with normal saline.  All wounds were injected with  0.5% Sensorcaine.  The supraumbilical fascia as well as suprapubic fascia were reapproximated using 0 Vicryl interrupted sutures.  All skin incisions were closed using staples.  Betadine ointment and dry sterile dressings were applied.  All tape and needle counts were correct at the end of the procedure.  Patient was extubated in the operating room and transferred to PACU in stable condition.  Complications: None  EBL: Minimal  Specimen: Appendix

## 2018-03-25 NOTE — Anesthesia Postprocedure Evaluation (Signed)
Anesthesia Post Note  Patient: Danielle RankinLilia G Pincus  Procedure(s) Performed: APPENDECTOMY LAPAROSCOPIC (N/A Abdomen)  Patient location during evaluation: PACU Anesthesia Type: General Level of consciousness: awake and alert and oriented Pain management: pain level controlled Vital Signs Assessment: post-procedure vital signs reviewed and stable Respiratory status: spontaneous breathing Cardiovascular status: stable Postop Assessment: no apparent nausea or vomiting Anesthetic complications: no     Last Vitals:  Vitals:   03/25/18 1338 03/25/18 1345  BP: 94/69 125/78  Pulse: 69 66  Resp: 14 14  Temp: 37.1 C   SpO2: 100% 100%    Last Pain:  Vitals:   03/25/18 1345  TempSrc:   PainSc: (P) 0-No pain                 Danaisha Celli A

## 2018-03-26 ENCOUNTER — Encounter (HOSPITAL_COMMUNITY): Payer: Self-pay | Admitting: General Surgery

## 2018-04-01 ENCOUNTER — Ambulatory Visit (INDEPENDENT_AMBULATORY_CARE_PROVIDER_SITE_OTHER): Payer: Self-pay | Admitting: General Surgery

## 2018-04-01 ENCOUNTER — Encounter: Payer: Self-pay | Admitting: General Surgery

## 2018-04-01 VITALS — BP 130/78 | HR 77 | Temp 97.3°F | Resp 18 | Wt 177.0 lb

## 2018-04-01 DIAGNOSIS — Z09 Encounter for follow-up examination after completed treatment for conditions other than malignant neoplasm: Secondary | ICD-10-CM

## 2018-04-01 NOTE — Progress Notes (Signed)
Subjective:     Rita Charles  Status post laparoscopic appendectomy.  Doing well. Objective:    BP 130/78 (BP Location: Left Arm, Patient Position: Sitting, Cuff Size: Normal)   Pulse 77   Temp (!) 97.3 F (36.3 C) (Temporal)   Resp 18   Wt 177 lb (80.3 kg)   LMP 03/08/2018   BMI 26.91 kg/m   General:  alert, cooperative and no distress  Abdomen soft, incisions healing well.  Staples removed, Steri-Strips applied. Final pathology consistent with diagnosis.     Assessment:    Doing well postoperatively.    Plan:   May gradually return to normal activity.  Follow-up here as needed.

## 2018-05-20 ENCOUNTER — Encounter (HOSPITAL_COMMUNITY): Payer: Self-pay

## 2018-05-20 ENCOUNTER — Emergency Department (HOSPITAL_COMMUNITY)
Admission: EM | Admit: 2018-05-20 | Discharge: 2018-05-20 | Disposition: A | Discharge status: Home / Self Care | Payer: Self-pay | Attending: Emergency Medicine | Admitting: Emergency Medicine

## 2018-05-20 ENCOUNTER — Other Ambulatory Visit: Payer: Self-pay

## 2018-05-20 DIAGNOSIS — N644 Mastodynia: Secondary | ICD-10-CM | POA: Insufficient documentation

## 2018-05-20 DIAGNOSIS — Z87891 Personal history of nicotine dependence: Secondary | ICD-10-CM | POA: Insufficient documentation

## 2018-05-20 DIAGNOSIS — Z3202 Encounter for pregnancy test, result negative: Secondary | ICD-10-CM | POA: Insufficient documentation

## 2018-05-20 LAB — POC URINE PREG, ED: Preg Test, Ur: NEGATIVE

## 2018-05-20 MED ORDER — IBUPROFEN 800 MG PO TABS: 800.0000 mg | ORAL_TABLET | Freq: Three times a day (TID) | ORAL | 0 refills | Status: DC

## 2018-05-20 NOTE — Discharge Instructions (Addendum)
You have a [facility] for Monday, 05/24/2018 at 3 PM.  They can schedule a mammogram for you.  As discussed, try to avoid caffeine, wear a bra for support and do not squeeze your nipples.

## 2018-05-20 NOTE — ED Provider Notes (Addendum)
[facility] EMERGENCY DEPARTMENT Provider Note   CSN: [id number] Arrival date & time: 05/20/18  1125     History   Chief Complaint Chief Complaint  Patient presents with  . Breast Pain    HPI Rita Charles is a 42 y.o. female.  HPI   Rita Charles is a 42 y.o. female who presents to the Emergency Department complaining of right breast pain for two weeks.  She describes a sharp, shooting pain from her lateral breast that radiates to her nipple.  She has also been squeezing her nipples and notices a milky discharge.  Breast is not tender to touch.  Concerned she has breast cancer.  No redness, chest wall pain or shortness of breath.  No injury, BCP's, or family history of breast CA.  Pt is non-smoker.  LMP was 05/01/18   History reviewed. No pertinent past medical history.  Patient Active Problem List   Diagnosis Date Noted  . Acute appendicitis 03/25/2018    Past Surgical History:  Procedure Laterality Date  . LAPAROSCOPIC APPENDECTOMY N/A 03/25/2018   Procedure: APPENDECTOMY LAPAROSCOPIC;  Surgeon: Franky Macho, MD;  Location: [facility];  Service: General;  Laterality: N/A;     OB History   None     Home Medications    Prior to Admission medications   Medication Sig Start Date End Date Taking? Authorizing Provider  HYDROcodone-acetaminophen (NORCO) 5-325 MG tablet Take 1-2 tablets by mouth every 4 (four) hours as needed for moderate pain. 03/25/18   Franky Macho, MD    Family History No family history on file.  Social History Social History   Tobacco Use  . Smoking status: Former Games developer  . Smokeless tobacco: Never Used  Substance Use Topics  . Alcohol use: Not Currently  . Drug use: No     Allergies   Patient has no known allergies.   Review of Systems Review of Systems  Constitutional: Negative for chills and fever.  Respiratory: Negative for chest tightness and shortness of breath.   Cardiovascular: Negative for chest pain.    Gastrointestinal: Negative for abdominal pain, nausea and vomiting.  Genitourinary: Negative for difficulty urinating, dysuria, menstrual problem and vaginal bleeding.       Right breast pain  Musculoskeletal: Negative for arthralgias and joint swelling.  Skin: Negative for color change, rash and wound.     Physical Exam Updated Vital Signs BP 121/82   Pulse 64   Temp 97.9 F (36.6 C) (Oral)   Resp 18   Wt 81.2 kg   LMP 05/01/2018   SpO2 100%   BMI 27.22 kg/m   Physical Exam  Constitutional: She is oriented to person, place, and time. She appears well-nourished.  Pt is anxious appearing  HENT:  Head: Atraumatic.  Cardiovascular: Normal rate and regular rhythm.  No murmur heard. Pulmonary/Chest: Effort normal and breath sounds normal. No respiratory distress. She exhibits no mass, no tenderness, no bony tenderness, no edema and no swelling. Right breast exhibits no inverted nipple, no mass, no nipple discharge and no skin change. Left breast exhibits no inverted nipple, no mass and no nipple discharge.  nml appearing right breast.  Non-tender on exam.  No palpable mass, nipple discharge, or erythema  Abdominal: Soft. She exhibits no distension. There is no tenderness.  Musculoskeletal: Normal range of motion.  Neurological: She is alert and oriented to person, place, and time.  Skin: Skin is warm. No rash noted. No erythema.  Nursing note and vitals reviewed.  ED Treatments / Results  Labs (all labs ordered are listed, but only abnormal results are displayed) Labs Reviewed  POC URINE PREG, ED    EKG None  Radiology No results found.  Procedures Procedures (including critical care time)  Medications Ordered in ED Medications - No data to display   Initial Impression / Assessment and Plan / ED Course  I have reviewed the triage vital signs and the nursing notes.  Pertinent labs & imaging results that were available during my care of the patient were  reviewed by me and considered in my medical decision making (see chart for details).    Pt with pain to right breast, nml appearing exam.  No concerning sx's for mastitis or abscess.  No palpable mass on my exam.  No previous mammogram.    [facility] and [facility] for f/u appt with them Monday 05/24/18 at 3:00 pm.  Will likely need mammo for further evaluation.  Pt counseled to avoid excessive caffeine, wear support bra.    Final Clinical Impressions(s) / ED Diagnoses   Final diagnoses:  Breast pain, right    ED Discharge Orders    None       Pauline Aus, PA-C 05/20/18 1352    Pauline Aus, PA-C 05/20/18 1353    Raeford Razor, MD 05/20/18 1526

## 2018-05-20 NOTE — ED Notes (Signed)
Patient complains of shooting rt breast pain.

## 2018-05-20 NOTE — ED Triage Notes (Signed)
Pt reports pain to r breast x 2 weeks.  Pt denies any rash or redness.  Reports she feels like her r breast is getting smaller than her left breast.  She also says she breast fed her child 12 years ago and still can express small amount of milk from both breasts.

## 2018-05-24 ENCOUNTER — Encounter: Payer: Self-pay | Admitting: Family Medicine

## 2018-05-24 ENCOUNTER — Ambulatory Visit: Payer: Self-pay | Admitting: Family Medicine

## 2018-05-24 ENCOUNTER — Other Ambulatory Visit: Payer: Self-pay

## 2018-05-24 VITALS — BP 118/77 | HR 68 | Ht 65.0 in | Wt 174.0 lb

## 2018-05-24 DIAGNOSIS — N644 Mastodynia: Secondary | ICD-10-CM

## 2018-05-24 NOTE — Progress Notes (Signed)
    GYNECOLOGY PROBLEM  VISIT ENCOUNTER NOTE  Subjective:   Rita Charles is a 42 y.o. G17P4 female here for a a problem GYN visit.  Current complaints: bilateral breast pain, located in the underside of both breasts. Right> Left. Reports this is new and present for the last couple months. She was seen in ER for this pain and referred here. Reports her breasts still make drops of milk and have since she stopped breastfeeding. She breastfed all four of her children for 2+ years each. She denies any masses or lesions.   She reports she recently started drinking more caffiene. Denies abnormal vaginal bleeding, discharge, pelvic pain, problems with intercourse or other gynecologic concerns.    Gynecologic History Patient's last menstrual period was 05/01/2018. Contraception: none Last Pap: "years ago when she was pregnant" Last mammogram: never had.  The following portions of the patient's history were reviewed and updated as appropriate: allergies, current medications, past family history, past medical history, past social history, past surgical history and problem list.  Review of Systems Pertinent items are noted in HPI.   Objective:  BP 118/77 (BP Location: Right Arm, Patient Position: Sitting, Cuff Size: Normal)   Pulse 68   Ht 5\' 5"  (1.651 m)   Wt 174 lb (78.9 kg)   LMP 05/01/2018   BMI 28.96 kg/m  Gen: well appearing, NAD HEENT: no scleral icterus CV: RR Lung: Normal WOB Ext: warm well perfused BREASTS: Symmetric in size. No masses, skin changes, nipple drainage, or lymphadenopathy.  Skin: small hyperpigmented lesion on right breast with slightly irregular borders (~44mm)   Assessment and Plan:  1. Breast pain Benign exam.  - Recommended decreased caffiene intake - MM DIGITAL SCREENING BILATERAL; Future  2. HCM: recommended she return for cervical cancer screening   Please refer to After Visit Summary for other counseling recommendations.   Return in about 3 months  (around 08/24/2018) for Yearly wellness exam.  Federico Flake, MD, MPH, ABFM Attending Physician Faculty Practice- [facility]

## 2018-06-04 ENCOUNTER — Ambulatory Visit (HOSPITAL_COMMUNITY)
Admission: RE | Admit: 2018-06-04 | Discharge: 2018-06-04 | Disposition: A | Discharge status: Home / Self Care | Payer: Self-pay | Source: Ambulatory Visit | Attending: Family Medicine | Admitting: Family Medicine

## 2018-06-04 DIAGNOSIS — N644 Mastodynia: Secondary | ICD-10-CM | POA: Insufficient documentation

## 2019-07-04 IMAGING — MG DIGITAL SCREENING BILATERAL MAMMOGRAM WITH TOMO AND CAD
8 series · 8 of 24 positions shown · non-contrast
Comparison: None.

CLINICAL DATA: Screening.

EXAM:
DIGITAL SCREENING BILATERAL MAMMOGRAM WITH TOMO AND CAD

[L CC synth-2D]
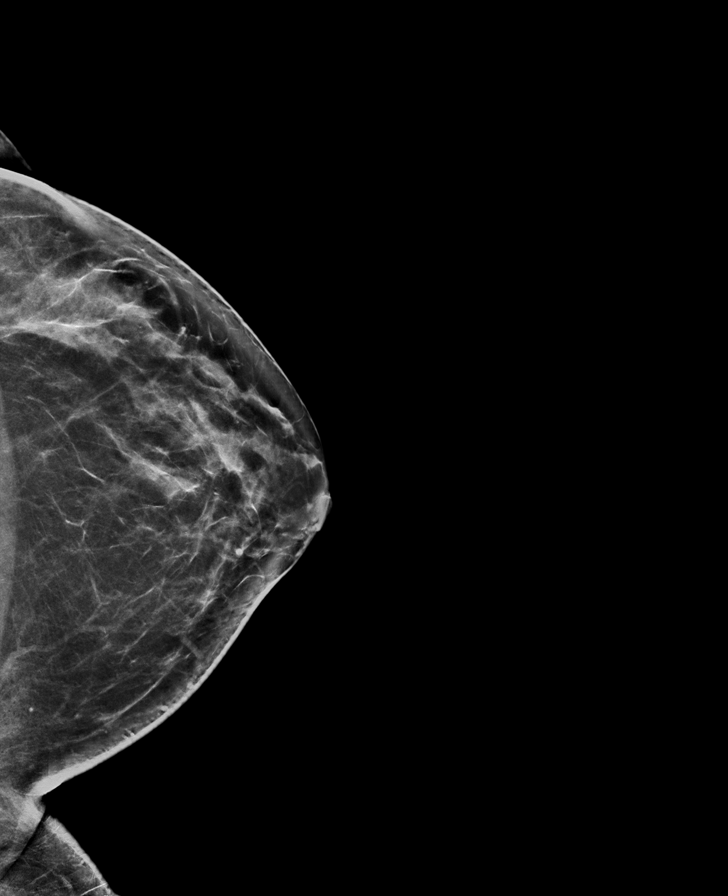

[R MLO synth-2D]
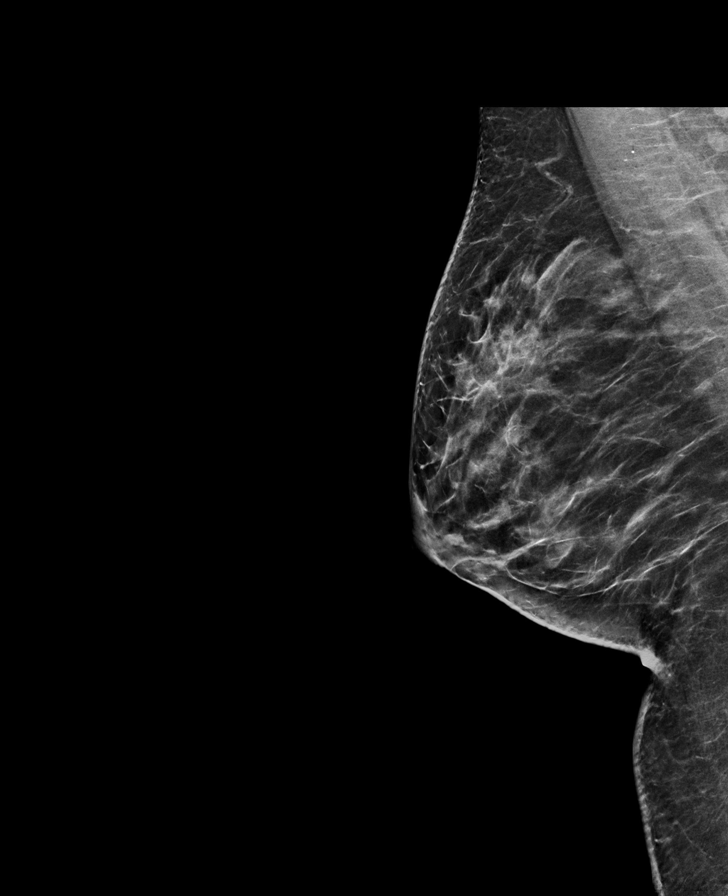

[R CC synth-2D]
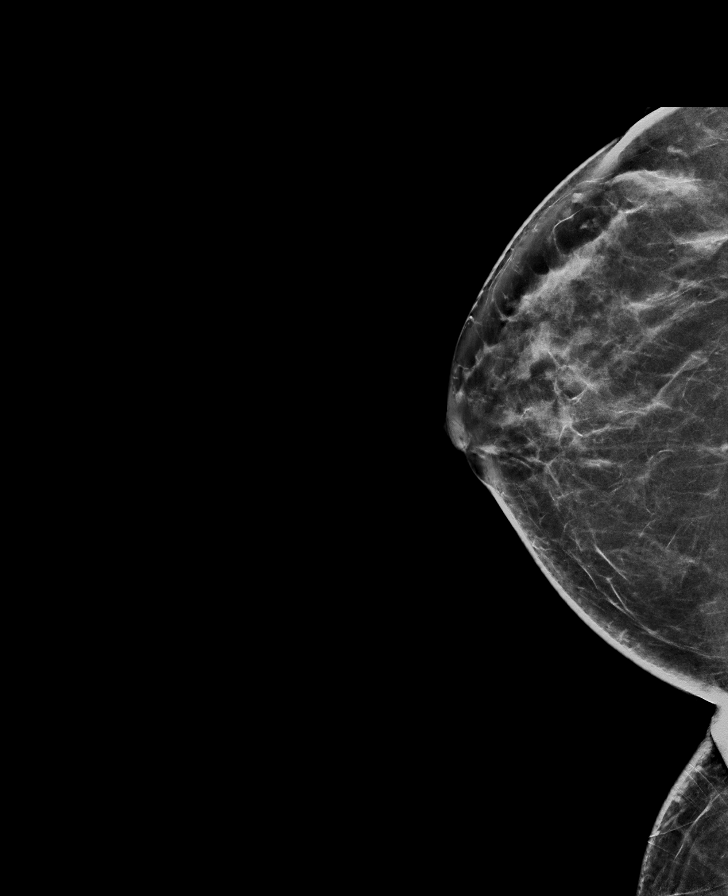

[L MLO synth-2D]
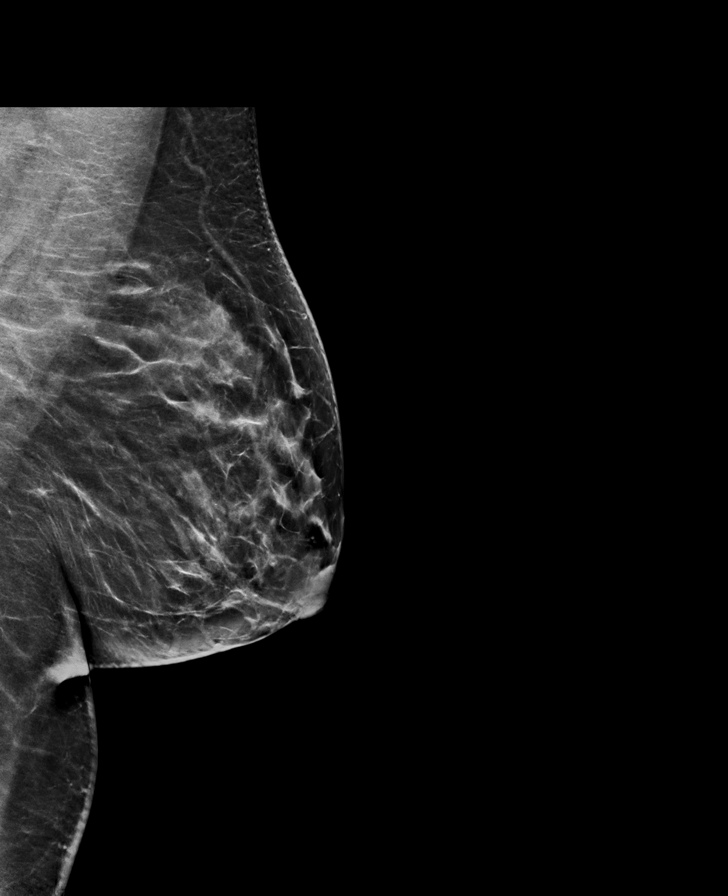

[R CC tomo · tomo slice 39/77.0]
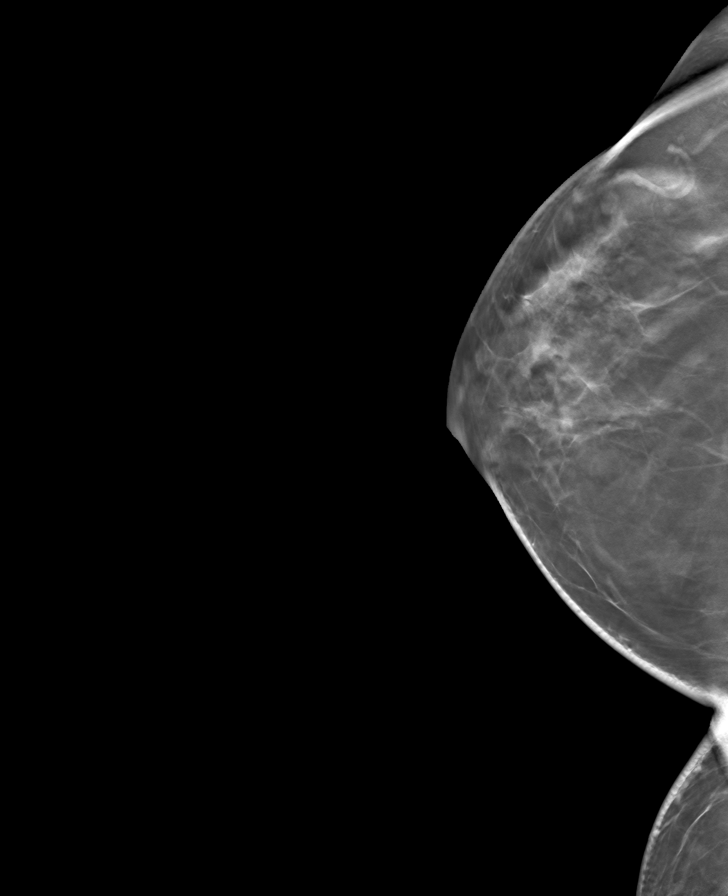

[R MLO tomo · tomo slice 37/73.0]
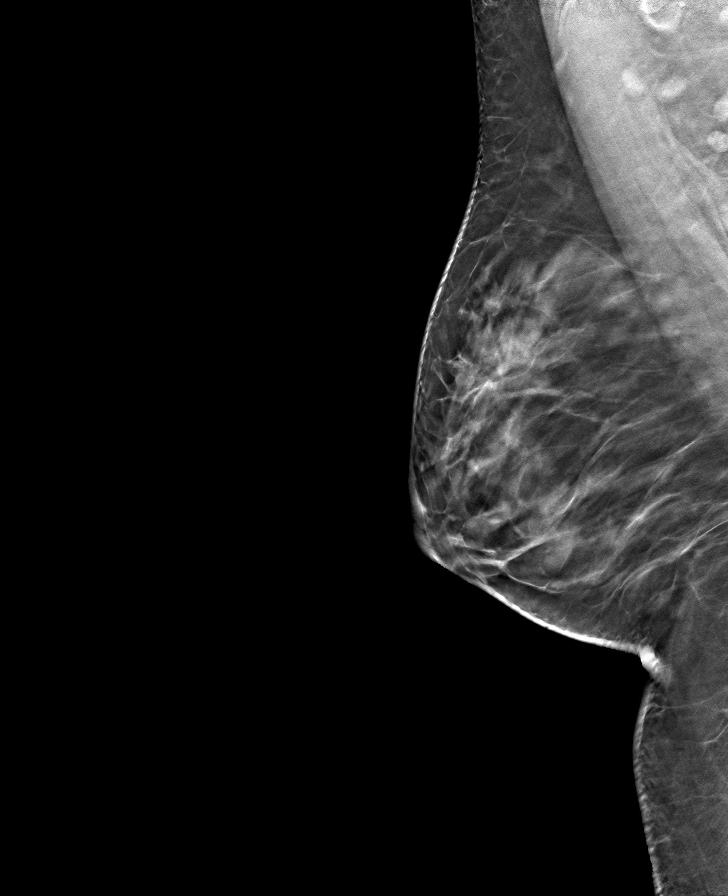

[L CC tomo · tomo slice 40/79.0]
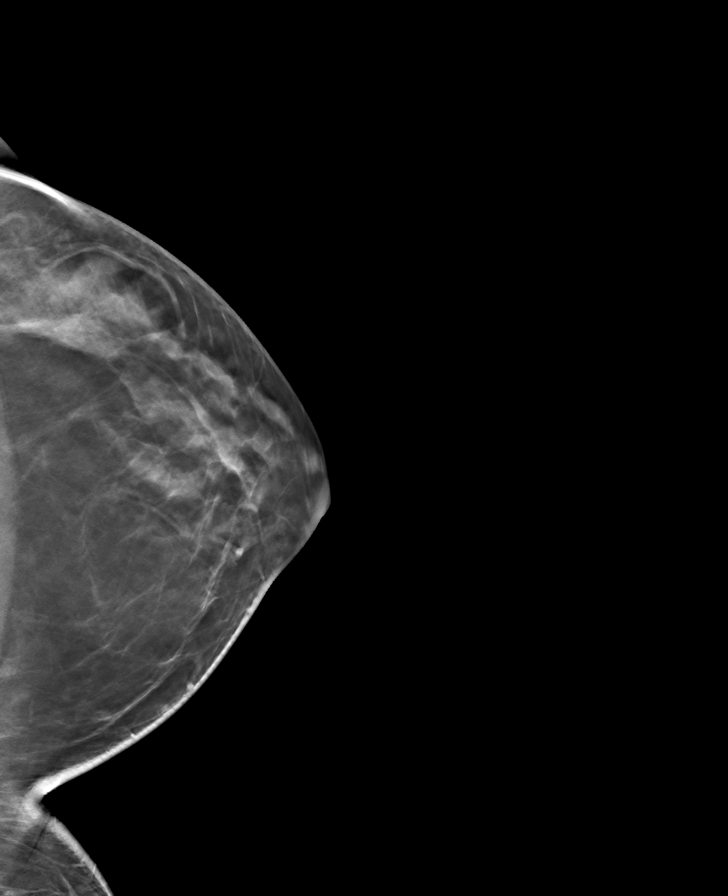

[L MLO tomo · tomo slice 39/78.0]
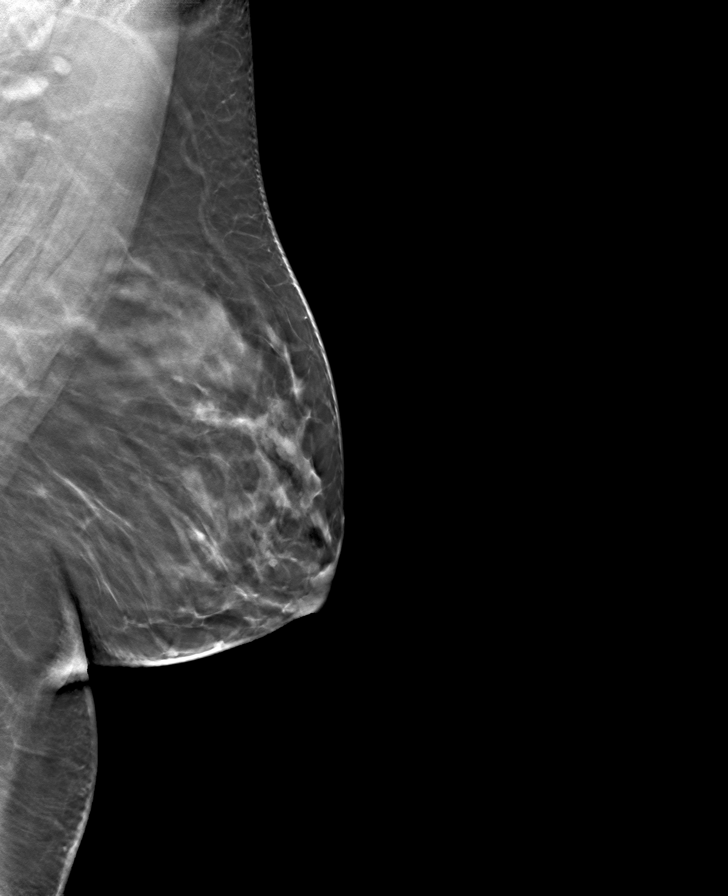

[8 of 24 positions shown; findings below may reference images not displayed]

ACR Breast Density Category c: The breast tissue is heterogeneously
dense, which may obscure small masses
FINDINGS: There are no findings suspicious for malignancy. Images were
processed with CAD.
IMPRESSION: No mammographic evidence of malignancy. A result letter of this
screening mammogram will be mailed directly to the patient.

RECOMMENDATION:
Screening mammogram in one year. (Code:EM-2-IHY)

BI-RADS CATEGORY  1: Negative.

## 2020-10-24 ENCOUNTER — Other Ambulatory Visit (HOSPITAL_COMMUNITY): Payer: Self-pay | Admitting: Nurse Practitioner

## 2020-10-24 DIAGNOSIS — Z1231 Encounter for screening mammogram for malignant neoplasm of breast: Secondary | ICD-10-CM

## 2020-11-09 ENCOUNTER — Other Ambulatory Visit: Payer: Self-pay

## 2020-11-09 ENCOUNTER — Ambulatory Visit (HOSPITAL_COMMUNITY)
Admission: RE | Admit: 2020-11-09 | Discharge: 2020-11-09 | Disposition: A | Discharge status: Home / Self Care | Payer: Self-pay | Source: Ambulatory Visit | Attending: Nurse Practitioner | Admitting: Nurse Practitioner

## 2020-11-09 DIAGNOSIS — Z1231 Encounter for screening mammogram for malignant neoplasm of breast: Secondary | ICD-10-CM | POA: Insufficient documentation

## 2021-02-06 ENCOUNTER — Other Ambulatory Visit: Payer: Self-pay | Admitting: *Deleted

## 2021-02-06 DIAGNOSIS — N6310 Unspecified lump in the right breast, unspecified quadrant: Secondary | ICD-10-CM

## 2021-02-11 ENCOUNTER — Ambulatory Visit: Payer: Self-pay | Admitting: *Deleted

## 2021-02-11 ENCOUNTER — Encounter (INDEPENDENT_AMBULATORY_CARE_PROVIDER_SITE_OTHER): Payer: Self-pay

## 2021-02-11 ENCOUNTER — Other Ambulatory Visit: Payer: Self-pay

## 2021-02-11 VITALS — BP 112/70 | Wt 184.0 lb

## 2021-02-11 DIAGNOSIS — N6313 Unspecified lump in the right breast, lower outer quadrant: Secondary | ICD-10-CM

## 2021-02-11 DIAGNOSIS — N644 Mastodynia: Secondary | ICD-10-CM

## 2021-02-11 DIAGNOSIS — Z1239 Encounter for other screening for malignant neoplasm of breast: Secondary | ICD-10-CM

## 2021-02-11 NOTE — Patient Instructions (Signed)
Explained breast self awareness with Danielle Rankin. Patient did not need a Pap smear today due to last Pap smear and HPV typing was 10/24/2020. Let her know BCCCP will cover Pap smears and HPV typing every 5 years unless has a history of abnormal Pap smears. Referred patient to [facility] for a right breast diagnostic mammogram. Appointment scheduled Tuesday, February 12, 2021 at 0800. Patient aware of appointment and will be there. Danielle Rankin verbalized understanding.  Naquisha Whitehair, Kathaleen Maser, RN 9:52 AM

## 2021-02-11 NOTE — Progress Notes (Signed)
[name] is a 45 y.o. female who presents to [facility] clinic today with complaint of right breast lump, redness, and pain. Patient went to the [facility] Department on 02/05/2021 and was given Doxycycline to treat a right breast abscess. Patient stated the pain has resolved, the lump has decreased in size, and redness improved since she started the antibiotic.    Pap Smear: Pap smear not completed today. Last Pap smear was 10/24/2020 at the [facility] clinic and was normal with negative HPV. Per patient has no history of an abnormal Pap smear. Last Pap smear result is not available in Epic. Pap smear report will be scanned into Epic.       Physical exam: Breasts Breasts symmetrical. No skin abnormalities left breast. Redness observed around right nipple between from 4 o'clock- 1 o'clock. No nipple retraction bilateral breasts. No nipple discharge bilateral breasts. No lymphadenopathy. No lumps palpated left breast. Palpated a lump within the right breast at 7 o'clock next to nipple. Complaints of pain when palpated right breast lump.  MM 3D SCREEN BREAST BILATERAL  Result Date: 06/07/2018 CLINICAL DATA:  Screening. EXAM: DIGITAL SCREENING BILATERAL MAMMOGRAM WITH TOMO AND CAD COMPARISON:  None. ACR Breast Density Category c: The breast tissue is heterogeneously dense, which may obscure small masses FINDINGS: There are no findings suspicious for malignancy. Images were processed with CAD. IMPRESSION: No mammographic evidence of malignancy. A result letter of this screening mammogram will be mailed directly to the patient. RECOMMENDATION: Screening mammogram in one year. (Code:SM-B-01Y) BI-RADS CATEGORY  1: Negative. Electronically Signed   By: Annia Belt M.D.   On: 06/07/2018 10:27   MS DIGITAL SCREENING TOMO BILATERAL  Result Date: 11/12/2020 CLINICAL DATA:  Screening. EXAM: DIGITAL SCREENING BILATERAL MAMMOGRAM WITH TOMOSYNTHESIS AND CAD TECHNIQUE:  Bilateral screening digital craniocaudal and mediolateral oblique mammograms were obtained. Bilateral screening digital breast tomosynthesis was performed. The images were evaluated with computer-aided detection. COMPARISON:  Previous exam(s). ACR Breast Density Category c: The breast tissue is heterogeneously dense, which may obscure small masses. FINDINGS: There are no findings suspicious for malignancy. The images were evaluated with computer-aided detection. IMPRESSION: No mammographic evidence of malignancy. A result letter of this screening mammogram will be mailed directly to the patient. RECOMMENDATION: Screening mammogram in one year. (Code:SM-B-01Y) BI-RADS CATEGORY  1: Negative. Electronically Signed   By: Emmaline Kluver M.D.   On: 11/12/2020 11:41     Pelvic/Bimanual Pap is not indicated today per BCCCP guidelines.   Smoking History: Patient is a former smoker that quit 10 years ago.   Patient Navigation: Patient education provided. Access to services provided for patient through BCCCP program.   Breast and Cervical Cancer Risk Assessment: Patient does not have family history of breast cancer, known genetic mutations, or radiation treatment to the chest before age 85. Patient does not have history of cervical dysplasia, immunocompromised, or DES exposure in-utero.  Risk Assessment     Risk Scores       02/11/2021   Last edited by: Narda Rutherford, LPN   5-year risk: 0.4 %   Lifetime risk: 5.5 %            A: BCCCP exam without pap smear Complaint of right breast lump, pain, and redness.  P: Referred patient to [facility] for a right breast diagnostic mammogram. Appointment scheduled Tuesday, February 12, 2021 at 0800.  Priscille Heidelberg, RN 02/11/2021 9:52 AM

## 2021-02-12 ENCOUNTER — Ambulatory Visit (HOSPITAL_COMMUNITY)
Admission: RE | Admit: 2021-02-12 | Discharge: 2021-02-12 | Disposition: A | Discharge status: Home / Self Care | Payer: Self-pay | Source: Ambulatory Visit | Attending: Obstetrics and Gynecology | Admitting: Obstetrics and Gynecology

## 2021-02-12 ENCOUNTER — Other Ambulatory Visit (HOSPITAL_COMMUNITY): Payer: Self-pay | Admitting: Obstetrics and Gynecology

## 2021-02-12 DIAGNOSIS — N6310 Unspecified lump in the right breast, unspecified quadrant: Secondary | ICD-10-CM

## 2021-02-12 DIAGNOSIS — R928 Other abnormal and inconclusive findings on diagnostic imaging of breast: Secondary | ICD-10-CM

## 2021-03-26 ENCOUNTER — Other Ambulatory Visit: Payer: Self-pay

## 2021-03-26 ENCOUNTER — Ambulatory Visit (HOSPITAL_COMMUNITY)
Admission: RE | Admit: 2021-03-26 | Discharge: 2021-03-26 | Disposition: A | Discharge status: Home / Self Care | Payer: Self-pay | Source: Ambulatory Visit | Attending: Obstetrics and Gynecology | Admitting: Obstetrics and Gynecology

## 2021-03-26 DIAGNOSIS — R928 Other abnormal and inconclusive findings on diagnostic imaging of breast: Secondary | ICD-10-CM

## 2022-02-01 ENCOUNTER — Emergency Department (HOSPITAL_COMMUNITY): Payer: Self-pay

## 2022-02-01 ENCOUNTER — Encounter (HOSPITAL_COMMUNITY): Payer: Self-pay

## 2022-02-01 ENCOUNTER — Other Ambulatory Visit: Payer: Self-pay

## 2022-02-01 ENCOUNTER — Inpatient Hospital Stay (HOSPITAL_COMMUNITY)
Admission: EM | Admit: 2022-02-01 | Discharge: 2022-02-06 | DRG: 438 | Disposition: A | Discharge status: Home / Self Care | Payer: Self-pay | Attending: Family Medicine | Admitting: Family Medicine

## 2022-02-01 DIAGNOSIS — R1010 Upper abdominal pain, unspecified: Secondary | ICD-10-CM

## 2022-02-01 DIAGNOSIS — E781 Pure hyperglyceridemia: Secondary | ICD-10-CM | POA: Diagnosis present

## 2022-02-01 DIAGNOSIS — E1110 Type 2 diabetes mellitus with ketoacidosis without coma: Secondary | ICD-10-CM | POA: Diagnosis present

## 2022-02-01 DIAGNOSIS — E1010 Type 1 diabetes mellitus with ketoacidosis without coma: Secondary | ICD-10-CM

## 2022-02-01 DIAGNOSIS — E876 Hypokalemia: Secondary | ICD-10-CM | POA: Diagnosis present

## 2022-02-01 DIAGNOSIS — K859 Acute pancreatitis without necrosis or infection, unspecified: Principal | ICD-10-CM | POA: Diagnosis present

## 2022-02-01 DIAGNOSIS — E039 Hypothyroidism, unspecified: Secondary | ICD-10-CM

## 2022-02-01 DIAGNOSIS — E11649 Type 2 diabetes mellitus with hypoglycemia without coma: Secondary | ICD-10-CM | POA: Diagnosis not present

## 2022-02-01 LAB — CBC WITH DIFFERENTIAL/PLATELET
Abs Immature Granulocytes: 0.06 10*3/uL (ref 0.00–0.07)
Basophils Absolute: 0 10*3/uL (ref 0.0–0.1)
Basophils Relative: 0 %
Eosinophils Absolute: 0 10*3/uL (ref 0.0–0.5)
Eosinophils Relative: 0 %
HCT: 42.2 % (ref 36.0–46.0)
Hemoglobin: 14.3 g/dL (ref 12.0–15.0)
Immature Granulocytes: 0 %
Lymphocytes Relative: 4 %
Lymphs Abs: 0.6 10*3/uL — ABNORMAL LOW (ref 0.7–4.0)
MCH: 27.8 pg (ref 26.0–34.0)
MCHC: 33.9 g/dL (ref 30.0–36.0)
MCV: 82.1 fL (ref 80.0–100.0)
Monocytes Absolute: 0.6 10*3/uL (ref 0.1–1.0)
Monocytes Relative: 4 %
Neutro Abs: 12.7 10*3/uL — ABNORMAL HIGH (ref 1.7–7.7)
Neutrophils Relative %: 92 %
Platelets: 290 10*3/uL (ref 150–400)
RBC: 5.14 MIL/uL — ABNORMAL HIGH (ref 3.87–5.11)
RDW: 15.5 % (ref 11.5–15.5)
WBC: 14.1 10*3/uL — ABNORMAL HIGH (ref 4.0–10.5)
nRBC: 0.4 % — ABNORMAL HIGH (ref 0.0–0.2)

## 2022-02-01 LAB — URINALYSIS, ROUTINE W REFLEX MICROSCOPIC
Bacteria, UA: NONE SEEN
Bilirubin Urine: NEGATIVE
Glucose, UA: >=500 mg/dL — AB
Ketones, ur: 80 mg/dL — AB
Leukocytes,Ua: NEGATIVE
Nitrite: NEGATIVE
Protein, ur: 30 mg/dL — AB
Specific Gravity, Urine: 1.026 (ref 1.005–1.030)
pH: 5 (ref 5.0–8.0)

## 2022-02-01 LAB — BASIC METABOLIC PANEL
Anion gap: 19 — ABNORMAL HIGH (ref 5–15)
Anion gap: 10 (ref 5–15)
Anion gap: 10 (ref 5–15)
Anion gap: 9 (ref 5–15)
Anion gap: 7 (ref 5–15)
BUN: 7 mg/dL (ref 6–20)
BUN: <5 mg/dL — ABNORMAL LOW (ref 6–20)
BUN: <5 mg/dL — ABNORMAL LOW (ref 6–20)
BUN: <5 mg/dL — ABNORMAL LOW (ref 6–20)
BUN: <5 mg/dL — ABNORMAL LOW (ref 6–20)
CO2: 8 mmol/L — ABNORMAL LOW (ref 22–32)
CO2: 15 mmol/L — ABNORMAL LOW (ref 22–32)
CO2: 14 mmol/L — ABNORMAL LOW (ref 22–32)
CO2: 16 mmol/L — ABNORMAL LOW (ref 22–32)
CO2: 19 mmol/L — ABNORMAL LOW (ref 22–32)
Calcium: 8.2 mg/dL — ABNORMAL LOW (ref 8.9–10.3)
Calcium: 8 mg/dL — ABNORMAL LOW (ref 8.9–10.3)
Calcium: 8.1 mg/dL — ABNORMAL LOW (ref 8.9–10.3)
Calcium: 8.1 mg/dL — ABNORMAL LOW (ref 8.9–10.3)
Calcium: 8.1 mg/dL — ABNORMAL LOW (ref 8.9–10.3)
Chloride: 100 mmol/L (ref 98–111)
Chloride: 107 mmol/L (ref 98–111)
Chloride: 107 mmol/L (ref 98–111)
Chloride: 104 mmol/L (ref 98–111)
Chloride: 104 mmol/L (ref 98–111)
Creatinine, Ser: 0.4 mg/dL — ABNORMAL LOW (ref 0.44–1.00)
Creatinine, Ser: 0.46 mg/dL (ref 0.44–1.00)
Creatinine, Ser: 0.36 mg/dL — ABNORMAL LOW (ref 0.44–1.00)
Creatinine, Ser: 0.4 mg/dL — ABNORMAL LOW (ref 0.44–1.00)
Creatinine, Ser: <0.3 mg/dL — ABNORMAL LOW (ref 0.44–1.00)
GFR, Estimated: >60 mL/min (ref >60)
GFR, Estimated: >60 mL/min (ref >60)
GFR, Estimated: >60 mL/min (ref >60)
GFR, Estimated: >60 mL/min (ref >60)
Glucose, Bld: 307 mg/dL — ABNORMAL HIGH (ref 70–99)
Glucose, Bld: 271 mg/dL — ABNORMAL HIGH (ref 70–99)
Glucose, Bld: 202 mg/dL — ABNORMAL HIGH (ref 70–99)
Glucose, Bld: 210 mg/dL — ABNORMAL HIGH (ref 70–99)
Glucose, Bld: 178 mg/dL — ABNORMAL HIGH (ref 70–99)
Potassium: 3.8 mmol/L (ref 3.5–5.1)
Potassium: 3.8 mmol/L (ref 3.5–5.1)
Potassium: 3.6 mmol/L (ref 3.5–5.1)
Potassium: 3.3 mmol/L — ABNORMAL LOW (ref 3.5–5.1)
Potassium: 3.6 mmol/L (ref 3.5–5.1)
Sodium: 127 mmol/L — ABNORMAL LOW (ref 135–145)
Sodium: 132 mmol/L — ABNORMAL LOW (ref 135–145)
Sodium: 131 mmol/L — ABNORMAL LOW (ref 135–145)
Sodium: 129 mmol/L — ABNORMAL LOW (ref 135–145)
Sodium: 130 mmol/L — ABNORMAL LOW (ref 135–145)

## 2022-02-01 LAB — BLOOD GAS, VENOUS
Acid-base deficit: 12.3 mmol/L — ABNORMAL HIGH (ref 0.0–2.0)
Bicarbonate: 14.2 mmol/L — ABNORMAL LOW (ref 20.0–28.0)
Drawn by: 27160
FIO2: 21 %
O2 Saturation: 65.8 %
Patient temperature: 36.5
pCO2, Ven: 33 mmHg — ABNORMAL LOW (ref 44–60)
pH, Ven: 7.24 — ABNORMAL LOW (ref 7.25–7.43)
pO2, Ven: 41 mmHg (ref 32–45)

## 2022-02-01 LAB — COMPREHENSIVE METABOLIC PANEL
ALT: 31 U/L (ref 0–44)
AST: 21 U/L (ref 15–41)
Albumin: 4.6 g/dL (ref 3.5–5.0)
Alkaline Phosphatase: 144 U/L — ABNORMAL HIGH (ref 38–126)
Anion gap: 16 — ABNORMAL HIGH (ref 5–15)
BUN: 5 mg/dL — ABNORMAL LOW (ref 6–20)
CO2: 13 mmol/L — ABNORMAL LOW (ref 22–32)
Calcium: 8.8 mg/dL — ABNORMAL LOW (ref 8.9–10.3)
Chloride: 100 mmol/L (ref 98–111)
Creatinine, Ser: 0.52 mg/dL (ref 0.44–1.00)
GFR, Estimated: >60 mL/min (ref >60)
Glucose, Bld: 364 mg/dL — ABNORMAL HIGH (ref 70–99)
Potassium: 3.7 mmol/L (ref 3.5–5.1)
Sodium: 129 mmol/L — ABNORMAL LOW (ref 135–145)
Total Bilirubin: 1.2 mg/dL (ref 0.3–1.2)
Total Protein: 8 g/dL (ref 6.5–8.1)

## 2022-02-01 LAB — GLUCOSE, CAPILLARY
Glucose-Capillary: 256 mg/dL — ABNORMAL HIGH (ref 70–99)
Glucose-Capillary: 256 mg/dL — ABNORMAL HIGH (ref 70–99)
Glucose-Capillary: 178 mg/dL — ABNORMAL HIGH (ref 70–99)
Glucose-Capillary: 187 mg/dL — ABNORMAL HIGH (ref 70–99)
Glucose-Capillary: 198 mg/dL — ABNORMAL HIGH (ref 70–99)
Glucose-Capillary: 207 mg/dL — ABNORMAL HIGH (ref 70–99)
Glucose-Capillary: 223 mg/dL — ABNORMAL HIGH (ref 70–99)
Glucose-Capillary: 226 mg/dL — ABNORMAL HIGH (ref 70–99)
Glucose-Capillary: 187 mg/dL — ABNORMAL HIGH (ref 70–99)
Glucose-Capillary: 176 mg/dL — ABNORMAL HIGH (ref 70–99)
Glucose-Capillary: 198 mg/dL — ABNORMAL HIGH (ref 70–99)
Glucose-Capillary: 183 mg/dL — ABNORMAL HIGH (ref 70–99)
Glucose-Capillary: 199 mg/dL — ABNORMAL HIGH (ref 70–99)

## 2022-02-01 LAB — MRSA NEXT GEN BY PCR, NASAL: MRSA by PCR Next Gen: NOT DETECTED

## 2022-02-01 LAB — POC URINE PREG, ED: Preg Test, Ur: NEGATIVE

## 2022-02-01 LAB — BETA-HYDROXYBUTYRIC ACID
Beta-Hydroxybutyric Acid: 4.4 mmol/L — ABNORMAL HIGH (ref 0.05–0.27)
Beta-Hydroxybutyric Acid: 0.55 mmol/L — ABNORMAL HIGH (ref 0.05–0.27)

## 2022-02-01 LAB — TRIGLYCERIDES: Triglycerides: UNDETERMINED mg/dL (ref <150)

## 2022-02-01 LAB — HEMOGLOBIN A1C
Hgb A1c MFr Bld: 13 % — ABNORMAL HIGH (ref 4.8–5.6)
Mean Plasma Glucose: 326.4 mg/dL

## 2022-02-01 LAB — TROPONIN I (HIGH SENSITIVITY): Troponin I (High Sensitivity): <2 ng/L (ref <18)

## 2022-02-01 LAB — LIPASE, BLOOD: Lipase: 99 U/L — ABNORMAL HIGH (ref 11–51)

## 2022-02-01 LAB — CBG MONITORING, ED
Glucose-Capillary: 315 mg/dL — ABNORMAL HIGH (ref 70–99)
Glucose-Capillary: 269 mg/dL — ABNORMAL HIGH (ref 70–99)

## 2022-02-01 LAB — HIV ANTIBODY (ROUTINE TESTING W REFLEX): HIV Screen 4th Generation wRfx: NONREACTIVE

## 2022-02-01 MED ORDER — INSULIN REGULAR(HUMAN) IN NACL 100-0.9 UT/100ML-% IV SOLN
INTRAVENOUS | Status: DC
Start: 2022-02-01 — End: 2022-02-01
  Administered 2022-02-01: 11.5 [IU]/h via INTRAVENOUS
  Filled 2022-02-01: qty 100

## 2022-02-01 MED ORDER — DIPHENHYDRAMINE HCL 50 MG/ML IJ SOLN
12.5000 mg | Freq: Four times a day (QID) | INTRAMUSCULAR | Status: DC | PRN
  Administered 2022-02-02: 12.5 mg via INTRAVENOUS
  Filled 2022-02-01: qty 1

## 2022-02-01 MED ORDER — FAMOTIDINE IN NACL 20-0.9 MG/50ML-% IV SOLN
20.0000 mg | Freq: Once | INTRAVENOUS | Status: AC
  Administered 2022-02-01: 20 mg via INTRAVENOUS
  Filled 2022-02-01: qty 50

## 2022-02-01 MED ORDER — LACTATED RINGERS IV SOLN
INTRAVENOUS | Status: DC
Start: 2022-02-01 — End: 2022-02-01

## 2022-02-01 MED ORDER — LACTATED RINGERS IV SOLN: INTRAVENOUS | Status: DC

## 2022-02-01 MED ORDER — PANTOPRAZOLE SODIUM 40 MG IV SOLR
40.0000 mg | INTRAVENOUS | Status: DC
  Administered 2022-02-01 – 2022-02-05 (×5): 40 mg via INTRAVENOUS
  Filled 2022-02-01 (×5): qty 10

## 2022-02-01 MED ORDER — HYDROMORPHONE HCL 1 MG/ML IJ SOLN
0.5000 mg | INTRAMUSCULAR | Status: DC | PRN
  Administered 2022-02-01: 0.5 mg via INTRAVENOUS
  Filled 2022-02-01: qty 0.5

## 2022-02-01 MED ORDER — ORAL CARE MOUTH RINSE: 15.0000 mL | OROMUCOSAL | Status: DC | PRN

## 2022-02-01 MED ORDER — HYDROMORPHONE HCL 1 MG/ML IJ SOLN
1.0000 mg | Freq: Once | INTRAMUSCULAR | Status: AC
  Administered 2022-02-01: 1 mg via INTRAVENOUS
  Filled 2022-02-01: qty 1

## 2022-02-01 MED ORDER — CHLORHEXIDINE GLUCONATE CLOTH 2 % EX PADS
6.0000 | MEDICATED_PAD | Freq: Every day | CUTANEOUS | Status: DC
  Administered 2022-02-01 – 2022-02-06 (×6): 6 via TOPICAL

## 2022-02-01 MED ORDER — DEXTROSE IN LACTATED RINGERS 5 % IV SOLN: INTRAVENOUS | Status: DC

## 2022-02-01 MED ORDER — DIPHENHYDRAMINE HCL 12.5 MG/5ML PO ELIX: 12.5000 mg | ORAL_SOLUTION | Freq: Four times a day (QID) | ORAL | Status: DC | PRN

## 2022-02-01 MED ORDER — ONDANSETRON HCL 4 MG/2ML IJ SOLN
4.0000 mg | Freq: Four times a day (QID) | INTRAMUSCULAR | Status: DC | PRN
  Administered 2022-02-01: 4 mg via INTRAVENOUS
  Filled 2022-02-01: qty 2

## 2022-02-01 MED ORDER — ENOXAPARIN SODIUM 40 MG/0.4ML IJ SOSY
40.0000 mg | PREFILLED_SYRINGE | INTRAMUSCULAR | Status: DC
  Administered 2022-02-01 – 2022-02-05 (×5): 40 mg via SUBCUTANEOUS
  Filled 2022-02-01 (×5): qty 0.4

## 2022-02-01 MED ORDER — POTASSIUM CHLORIDE 10 MEQ/100ML IV SOLN
10.0000 meq | INTRAVENOUS | Status: AC
  Administered 2022-02-01 (×2): 10 meq via INTRAVENOUS
  Filled 2022-02-01 (×2): qty 100

## 2022-02-01 MED ORDER — DEXTROSE 50 % IV SOLN: 0.0000 mL | INTRAVENOUS | Status: DC | PRN

## 2022-02-01 MED ORDER — HYDROMORPHONE HCL 1 MG/ML IJ SOLN
1.0000 mg | INTRAMUSCULAR | Status: DC | PRN
  Administered 2022-02-01: 1 mg via INTRAVENOUS
  Filled 2022-02-01: qty 1

## 2022-02-01 MED ORDER — INSULIN REGULAR(HUMAN) IN NACL 100-0.9 UT/100ML-% IV SOLN
INTRAVENOUS | Status: DC
  Administered 2022-02-01: 8 [IU]/h via INTRAVENOUS
  Administered 2022-02-01: 8.5 [IU]/h via INTRAVENOUS
  Filled 2022-02-01: qty 100

## 2022-02-01 MED ORDER — ONDANSETRON HCL 4 MG/2ML IJ SOLN
4.0000 mg | Freq: Once | INTRAMUSCULAR | Status: AC
  Administered 2022-02-01: 4 mg via INTRAVENOUS
  Filled 2022-02-01: qty 2

## 2022-02-01 MED ORDER — SODIUM CHLORIDE 0.9 % IV BOLUS
500.0000 mL | Freq: Once | INTRAVENOUS | Status: AC
  Administered 2022-02-01: 500 mL via INTRAVENOUS

## 2022-02-01 MED ORDER — INSULIN GLARGINE-YFGN 100 UNIT/ML ~~LOC~~ SOLN
10.0000 [IU] | Freq: Every day | SUBCUTANEOUS | Status: DC
  Administered 2022-02-01: 10 [IU] via SUBCUTANEOUS
  Filled 2022-02-01 (×2): qty 0.1

## 2022-02-01 MED ORDER — DEXTROSE 50 % IV SOLN
0.0000 mL | INTRAVENOUS | Status: DC | PRN
  Administered 2022-02-03 – 2022-02-04 (×2): 50 mL via INTRAVENOUS
  Filled 2022-02-01 (×3): qty 50

## 2022-02-01 MED ORDER — NALOXONE HCL 0.4 MG/ML IJ SOLN: 0.4000 mg | INTRAMUSCULAR | Status: DC | PRN

## 2022-02-01 MED ORDER — LACTATED RINGERS IV BOLUS
20.0000 mL/kg | Freq: Once | INTRAVENOUS | Status: AC
  Administered 2022-02-01: 1542 mL via INTRAVENOUS

## 2022-02-01 MED ORDER — LACTATED RINGERS IV BOLUS
20.0000 mL/kg | Freq: Once | INTRAVENOUS | Status: AC
  Administered 2022-02-01: 500 mL via INTRAVENOUS

## 2022-02-01 MED ORDER — SODIUM CHLORIDE 0.9% FLUSH: 9.0000 mL | INTRAVENOUS | Status: DC | PRN

## 2022-02-01 MED ORDER — POTASSIUM CHLORIDE 10 MEQ/100ML IV SOLN
10.0000 meq | INTRAVENOUS | Status: AC
  Administered 2022-02-01 (×4): 10 meq via INTRAVENOUS
  Filled 2022-02-01 (×3): qty 100

## 2022-02-01 MED ORDER — IOHEXOL 300 MG/ML  SOLN
100.0000 mL | Freq: Once | INTRAMUSCULAR | Status: AC | PRN
  Administered 2022-02-01: 100 mL via INTRAVENOUS

## 2022-02-01 MED ORDER — HYDROMORPHONE 1 MG/ML IV SOLN
INTRAVENOUS | Status: DC
  Administered 2022-02-01: 5.3 mg via INTRAVENOUS
  Administered 2022-02-01: 30 mg via INTRAVENOUS
  Administered 2022-02-02: 0.6 mg via INTRAVENOUS
  Administered 2022-02-02: 3.9 mg via INTRAVENOUS
  Administered 2022-02-02: 2.4 mg via INTRAVENOUS
  Administered 2022-02-02: 3 mg via INTRAVENOUS
  Administered 2022-02-02: 4.2 mg via INTRAVENOUS
  Administered 2022-02-02: 0.6 mg via INTRAVENOUS
  Administered 2022-02-03: 1.8 mg via INTRAVENOUS
  Administered 2022-02-03: 1.2 mg via INTRAVENOUS
  Administered 2022-02-03: 1.8 mg via INTRAVENOUS
  Administered 2022-02-03: 2.4 mg via INTRAVENOUS
  Administered 2022-02-03: 3.9 mg via INTRAVENOUS
  Administered 2022-02-03 – 2022-02-04 (×2): 0.3 mg via INTRAVENOUS
  Administered 2022-02-04: 0.6 mg via INTRAVENOUS
  Administered 2022-02-04: 0.3 mg via INTRAVENOUS
  Administered 2022-02-04: 0.9 mg via INTRAVENOUS
  Filled 2022-02-01: qty 30

## 2022-02-01 NOTE — ED Provider Notes (Signed)
[facility] EMERGENCY DEPARTMENT Provider Note   CSN: [id number] Arrival date & time: 02/01/22  8563     History  Chief Complaint  Patient presents with   Abdominal Pain    Rita Charles is a 46 y.o. female.  Patient presents to the emergency department for evaluation of abdominal pain.  Patient reports that the pain began yesterday afternoon.  She reports constant pain in the center of her upper abdomen.  Pain worsened this morning after eating.       Home Medications Prior to Admission medications   Medication Sig Start Date End Date Taking? Authorizing Provider  HYDROcodone-acetaminophen (NORCO) 5-325 MG tablet Take 1-2 tablets by mouth every 4 (four) hours as needed for moderate pain. 03/25/18   Franky Macho, MD  ibuprofen (ADVIL,MOTRIN) 800 MG tablet Take 1 tablet (800 mg total) by mouth 3 (three) times daily. 05/20/18   Triplett, Tammy, PA-C      Allergies    Patient has no known allergies.    Review of Systems   Review of Systems  Physical Exam Updated Vital Signs BP (!) 128/91   Pulse 94   Temp 97.9 F (36.6 C) (Oral)   Resp (!) 22   Ht 5\' 5"  (1.651 m)   Wt 77.1 kg   SpO2 98%   BMI 28.29 kg/m  Physical Exam Vitals and nursing note reviewed.  Constitutional:      General: She is in acute distress.     Appearance: She is well-developed.  HENT:     Head: Normocephalic and atraumatic.     Mouth/Throat:     Mouth: Mucous membranes are moist.  Eyes:     General: Vision grossly intact. Gaze aligned appropriately.     Extraocular Movements: Extraocular movements intact.     Conjunctiva/sclera: Conjunctivae normal.  Cardiovascular:     Rate and Rhythm: Normal rate and regular rhythm.     Pulses: Normal pulses.     Heart sounds: Normal heart sounds, S1 normal and S2 normal. No murmur heard.    No friction rub. No gallop.  Pulmonary:     Effort: Pulmonary effort is normal. No respiratory distress.     Breath sounds: Normal breath sounds.   Abdominal:     General: Bowel sounds are normal.     Palpations: Abdomen is soft.     Tenderness: There is abdominal tenderness in the epigastric area. There is no guarding or rebound.     Hernia: No hernia is present.  Musculoskeletal:        General: No swelling.     Cervical back: Full passive range of motion without pain, normal range of motion and neck supple. No spinous process tenderness or muscular tenderness. Normal range of motion.     Right lower leg: No edema.     Left lower leg: No edema.  Skin:    General: Skin is warm and dry.     Capillary Refill: Capillary refill takes less than 2 seconds.     Findings: No ecchymosis, erythema, rash or wound.  Neurological:     General: No focal deficit present.     Mental Status: She is alert and oriented to person, place, and time.     GCS: GCS eye subscore is 4. GCS verbal subscore is 5. GCS motor subscore is 6.     Cranial Nerves: Cranial nerves 2-12 are intact.     Sensory: Sensation is intact.     Motor: Motor function is intact.  Coordination: Coordination is intact.  Psychiatric:        Attention and Perception: Attention normal.        Mood and Affect: Mood normal.        Speech: Speech normal.        Behavior: Behavior normal.     ED Results / Procedures / Treatments   Labs (all labs ordered are listed, but only abnormal results are displayed) Labs Reviewed  CBC WITH DIFFERENTIAL/PLATELET  COMPREHENSIVE METABOLIC PANEL  LIPASE, BLOOD  URINALYSIS, ROUTINE W REFLEX MICROSCOPIC  TROPONIN I (HIGH SENSITIVITY)    EKG EKG Interpretation  Date/Time:  Saturday February 01 2022 06:33:56 EDT Ventricular Rate:  94 PR Interval:  168 QRS Duration: 83 QT Interval:  350 QTC Calculation: 438 R Axis:   79 Text Interpretation: Sinus rhythm Probable left atrial enlargement Borderline T wave abnormalities Confirmed by Gilda Crease [phone]) on 02/01/2022 6:42:21 AM  Radiology No results  found.  Procedures Procedures    Medications Ordered in ED Medications  sodium chloride 0.9 % bolus 500 mL (has no administration in time range)  ondansetron (ZOFRAN) injection 4 mg (has no administration in time range)  HYDROmorphone (DILAUDID) injection 1 mg (1 mg Intravenous Given 02/01/22 0640)    ED Course/ Medical Decision Making/ A&P                           Medical Decision Making Amount and/or Complexity of Data Reviewed External Data Reviewed: radiology and notes. Labs: ordered. Decision-making details documented in ED Course. Radiology: ordered.  Risk Prescription drug management.   Presents to the emergency department for evaluation of upper abdominal pain.  Symptoms began yesterday and worsened today after eating.  Patient in moderate distress at arrival with severe stabbing pain in the center of her upper abdomen.  She does have epigastric tenderness with mild right upper quadrant tenderness.  Differential diagnosis considered at arrival includes gastritis, GERD, cholecystitis, pancreatitis, small bowel obstruction.  Less likely to be cardiac in origin.  Lab work to further evaluate for intra-abdominal pathology ordered.  Troponin ordered.  EKG at presentation does not show ischemia or infarct.  Work-up to be signed out to oncoming ER physician to follow-up.  Patient will likely require imaging, ultrasound versus CT scan.        Final Clinical Impression(s) / ED Diagnoses Final diagnoses:  Pain of upper abdomen    Rx / DC Orders ED Discharge Orders     None         Gilda Crease, MD 02/01/22 [phone]

## 2022-02-01 NOTE — ED Triage Notes (Signed)
Pt arrived from home w complaints of abdominal pain that started yesterday. Says that pain has gotten worse this morning after eating biscuit. Describes pain as sharp and stabbing.

## 2022-02-01 NOTE — ED Notes (Signed)
Told pt urine sample was needed. Pt just received pain meds at this time.

## 2022-02-01 NOTE — H&P (Signed)
History and Physical    Rita Charles:[id number] DOB: 09/08/1975 DOA: 02/01/2022  PCP: Patient, No Pcp Per  Patient coming from: Home  I have personally briefly reviewed patient's old medical records in [facility]  Chief Complaint: Abdominal pain  HPI: Rita Charles is a 46 y.o. female with no significant past medical history, presents to the emergency room with abdominal pain.  She is not on any chronic medications.  Reports onset of pain in the center and upper abdomen.  Symptoms began yesterday and have progressively gotten worse.  Reports that her pain got worse after trying to eat a biscuit.  She has not had any fever.  No diarrhea.  Denies any prior history of gallstones, no history of alcohol intake.  She was evaluated in the emergency room, where she was noted to be hyperglycemic with a blood sugar of 364, bicarb of 13, anion gap of 16.  Lipase mildly elevated at 99.  CT abdomen was performed that showed acute pancreatitis.  Beta hydroxybutyric acid elevated at 4.4.  She was started on IV fluids and intravenous insulin.  Review of Systems: As per HPI otherwise 10 point review of systems negative.    No past medical history on file.  Past Surgical History:  Procedure Laterality Date   APPENDECTOMY     LAPAROSCOPIC APPENDECTOMY N/A 03/25/2018   Procedure: APPENDECTOMY LAPAROSCOPIC;  Surgeon: Franky Macho, MD;  Location: [facility];  Service: General;  Laterality: N/A;    Social History:  reports that she has never smoked. She has never used smokeless tobacco. She reports that she does not currently use alcohol. She reports that she does not use drugs.  No Known Allergies  Family History  Problem Relation Age of Onset   Heart disease Paternal Grandfather    Heart disease Father    Colon cancer Father      Prior to Admission medications   Not on File    Physical Exam: Vitals:   02/01/22 1513 02/01/22 1530 02/01/22 1600 02/01/22 1630  BP: 140/85 131/71  (!) 133/57 134/76  Pulse: 97 99 (!) 102 99  Resp: (!) 26 (!) 22 (!) 31 (!) 30  Temp:      TempSrc:      SpO2: 97% 95% 95% 94%  Weight:      Height:        Constitutional: NAD, calm, comfortable Eyes: PERRL, lids and conjunctivae normal ENMT: Mucous membranes are moist. Posterior pharynx clear of any exudate or lesions.Normal dentition.  Neck: normal, supple, no masses, no thyromegaly Respiratory: clear to auscultation bilaterally, no wheezing, no crackles. Normal respiratory effort. No accessory muscle use.  Cardiovascular: Regular rate and rhythm, no murmurs / rubs / gallops. No extremity edema. 2+ pedal pulses. No carotid bruits.  Abdomen: no tenderness, no masses palpated. No hepatosplenomegaly. Bowel sounds positive.  Musculoskeletal: no clubbing / cyanosis. No joint deformity upper and lower extremities. Good ROM, no contractures. Normal muscle tone.  Skin: no rashes, lesions, ulcers. No induration Neurologic: CN 2-12 grossly intact. Sensation intact, DTR normal. Strength 5/5 in all 4.  Psychiatric: Normal judgment and insight. Alert and oriented x 3. Normal mood.    Labs on Admission: I have personally reviewed following labs and imaging studies  CBC: Recent Labs  Lab 02/01/22 0646  WBC 14.1*  NEUTROABS 12.7*  HGB 14.3  HCT 42.2  MCV 82.1  PLT 290   Basic Metabolic Panel: Recent Labs  Lab 02/01/22 0646 02/01/22 0804 02/01/22 0921 02/01/22  1147  NA 129* 127* 132* 131*  K 3.7 3.8 3.8 3.6  CL 100 100 107 107  CO2 13* 8* 15* 14*  GLUCOSE 364* 307* 271* 202*  BUN 5* 7 <5* <5*  CREATININE 0.52 0.40* 0.46 0.36*  CALCIUM 8.8* 8.2* 8.0* 8.1*   GFR: Estimated Creatinine Clearance: 93.4 mL/min (A) (by C-G formula based on SCr of 0.36 mg/dL (L)). Liver Function Tests: Recent Labs  Lab 02/01/22 0646  AST 21  ALT 31  ALKPHOS 144*  BILITOT 1.2  PROT 8.0  ALBUMIN 4.6   Recent Labs  Lab 02/01/22 0646  LIPASE 99*   No results for input(s): "AMMONIA" in the  last 168 hours. Coagulation Profile: No results for input(s): "INR", "PROTIME" in the last 168 hours. Cardiac Enzymes: No results for input(s): "CKTOTAL", "CKMB", "CKMBINDEX", "TROPONINI" in the last 168 hours. BNP (last 3 results) No results for input(s): "PROBNP" in the last 8760 hours. HbA1C: No results for input(s): "HGBA1C" in the last 72 hours. CBG: Recent Labs  Lab 02/01/22 1114 02/01/22 1214 02/01/22 1320 02/01/22 1433 02/01/22 1539  GLUCAP 256* 178* 187* 198* 207*   Lipid Profile: Recent Labs    02/01/22 1015  TRIG UNABLE TO REPORT DUE TO LIPEMIC INTERFERENCE   Thyroid Function Tests: No results for input(s): "TSH", "T4TOTAL", "FREET4", "T3FREE", "THYROIDAB" in the last 72 hours. Anemia Panel: No results for input(s): "VITAMINB12", "FOLATE", "FERRITIN", "TIBC", "IRON", "RETICCTPCT" in the last 72 hours. Urine analysis:    Component Value Date/Time   COLORURINE STRAW (A) 02/01/2022 0730   APPEARANCEUR CLEAR 02/01/2022 0730   LABSPEC 1.026 02/01/2022 0730   PHURINE 5.0 02/01/2022 0730   GLUCOSEU >=500 (A) 02/01/2022 0730   HGBUR MODERATE (A) 02/01/2022 0730   BILIRUBINUR NEGATIVE 02/01/2022 0730   KETONESUR 80 (A) 02/01/2022 0730   PROTEINUR 30 (A) 02/01/2022 0730   NITRITE NEGATIVE 02/01/2022 0730   LEUKOCYTESUR NEGATIVE 02/01/2022 0730    Radiological Exams on Admission: CT ABDOMEN PELVIS W CONTRAST  Result Date: 02/01/2022 CLINICAL DATA:  Abdominal pain. EXAM: CT ABDOMEN AND PELVIS WITH CONTRAST TECHNIQUE: Multidetector CT imaging of the abdomen and pelvis was performed using the standard protocol following bolus administration of intravenous contrast. RADIATION DOSE REDUCTION: This exam was performed according to the departmental dose-optimization program which includes automated exposure control, adjustment of the mA and/or kV according to patient size and/or use of iterative reconstruction technique. CONTRAST:  OMNIPAQUE IOHEXOL 300 MG/ML  SOLN  COMPARISON:  03/25/2018 FINDINGS: Lower chest: Subsegmental atelectasis noted in the lung bases. Hepatobiliary: No suspicious focal abnormality within the liver parenchyma. There is no evidence for gallstones, gallbladder wall thickening, or pericholecystic fluid. No intrahepatic or extrahepatic biliary dilation. Pancreas: There is edema in the parenchyma of the pancreatic body and tail with fairly substantial peripancreatic edema/inflammation around the body and tail of pancreas. No main duct dilatation. Pancreatic parenchyma enhances throughout. Spleen: No splenomegaly. No focal mass lesion. Adrenals/Urinary Tract: No adrenal nodule or mass. Kidneys unremarkable. No evidence for hydroureter. The urinary bladder appears normal for the degree of distention. Stomach/Bowel: Stomach is unremarkable. No gastric wall thickening. No evidence of outlet obstruction. Duodenum is normally positioned as is the ligament of Treitz. No small bowel wall thickening. No small bowel dilatation. The terminal ileum is normal. Nonvisualization of the appendix is consistent with the reported history of appendectomy. No gross colonic mass. No colonic wall thickening. Vascular/Lymphatic: No abdominal aortic aneurysm. No abdominal aortic atherosclerotic calcification. Portal vein and splenic vein are patent. There  is no gastrohepatic or hepatoduodenal ligament lymphadenopathy. No retroperitoneal or mesenteric lymphadenopathy. No pelvic sidewall lymphadenopathy. Reproductive: The uterus is unremarkable.  There is no adnexal mass. Other: No intraperitoneal free fluid. Musculoskeletal: No worrisome lytic or sclerotic osseous abnormality. IMPRESSION: Edema in the parenchyma of the pancreatic body and tail with fairly substantial peripancreatic edema/inflammation. Imaging features consistent with acute pancreatitis. No evidence for pancreatic necrosis or pseudocyst. No vascular complication. No main duct dilatation. Electronically Signed   By:  Kennith Center M.D.   On: 02/01/2022 09:30    EKG: Independently reviewed.  Sinus rhythm without acute ischemic changes  Assessment/Plan Principal Problem:   DKA (diabetic ketoacidosis) (HCC) Active Problems:   Acute pancreatitis     Acute pancreatitis -No history of alcohol or biliary stones -Nothing obvious in biliary tree/gallbladder on CT imaging -We will check lipid panel to evaluate for hypertriglyceridemia -Treat supportively with bowel rest, IV fluids -She has received frequent doses of IV Dilaudid with suboptimal pain relief -We will trial a Dilaudid PCA  Diabetic ketoacidosis and new onset diabetes -Continue on intravenous insulin and IV fluids -Check A1c -Keep n.p.o. for now while on insulin infusion -We will eventually transition to subcutaneous insulin as labs improve  DVT prophylaxis: Lovenox Code Status: Full code Family Communication: Husband at the bedside Disposition Plan: Discharge home once symptoms have improved Consults called:   Admission status: Inpatient, stepdown  Erick Blinks MD [facility]   If 7PM-7AM, please contact night-coverage [email or web address]   02/01/2022, 4:41 PM

## 2022-02-01 NOTE — ED Provider Notes (Signed)
Patient presented with abdominal pain and vomiting.  Initially seen by Dr. Oletta Cohn.  Please see his note Physical Exam  BP 120/77   Pulse 94   Temp 97.9 F (36.6 C) (Oral)   Resp (!) 21   Ht 1.651 m (5\' 5" )   Wt 77.1 kg   SpO2 98%   BMI 28.29 kg/m   Physical Exam General uncomfortable in distress due to her pain Abdomen: Diffusely tender Procedures  .Critical Care  Performed by: , MD Authorized by: Linwood Dibbles, MD   Critical care provider statement:    Critical care time (minutes):  30   Critical care was time spent personally by me on the following activities:  Development of treatment plan with patient or surrogate, discussions with consultants, evaluation of patient's response to treatment, examination of patient, ordering and review of laboratory studies, ordering and review of radiographic studies, ordering and performing treatments and interventions, pulse oximetry, re-evaluation of patient's condition and review of old charts   ED Course / MDM   Clinical Course as of 02/01/22 1007  Sat Feb 01, 2022  0940 Blood gas, venous(!) Venous pH decreased [JK]  0940 Urinalysis, Routine w reflex microscopic Urine, Clean Catch(!) Urine ketones noted [JK]  0940 Comprehensive metabolic panel(!) Hyperglycemia with anion gap metabolic acidosis consistent with DKA [JK]  0941 CT ABDOMEN PELVIS W CONTRAST CT scan shows evidence of acute pancreatitis [JK]  0941 Lipase, blood(!) Lipase mildly elevated 99 [JK]    Clinical Course User Index [JK] Feb 03, 2022, MD   Medical Decision Making Problems Addressed: Acute pancreatitis, unspecified complication status, unspecified pancreatitis type: acute illness or injury that poses a threat to life or bodily functions Diabetic ketoacidosis without coma associated with type 1 diabetes mellitus (HCC): acute illness or injury that poses a threat to life or bodily functions  Amount and/or Complexity of Data Reviewed Labs: ordered.  Decision-making details documented in ED Course. Radiology: ordered. Decision-making details documented in ED Course. Discussion of management or test interpretation with external provider(s): Case discussed with Dr. Linwood Dibbles regarding admission and hospitalization  Risk Prescription drug management. Drug therapy requiring intensive monitoring for toxicity. Decision regarding hospitalization.   Patient presented with acute abdominal pain.  Patient's labs were notable for an anion gap metabolic acidosis, hyperglycemia consistent with DKA.  Patient also has slight elevation in her lipase.  CT scan shows evidence of inflammation around the pancreas consistent with pancreatitis.  No signs of any gallstones.  Patient denies any alcohol use.  Patient denies any prior history of diabetes is unclear if this is a result of her pancreatic inflammation versus DKA being the precipitating illness.  Will admit to the hospital for further treatment.      Kerry Hough, MD 02/01/22 1009

## 2022-02-02 DIAGNOSIS — K858 Other acute pancreatitis without necrosis or infection: Secondary | ICD-10-CM

## 2022-02-02 DIAGNOSIS — E781 Pure hyperglyceridemia: Secondary | ICD-10-CM | POA: Diagnosis present

## 2022-02-02 LAB — TRIGLYCERIDES
Triglycerides: 1152 mg/dL — ABNORMAL HIGH (ref <150)
Triglycerides: 708 mg/dL — ABNORMAL HIGH (ref <150)

## 2022-02-02 LAB — LIPID PANEL
Cholesterol: 136 mg/dL (ref 0–200)
HDL: 21 mg/dL — ABNORMAL LOW (ref >40)
LDL Cholesterol: UNDETERMINED mg/dL (ref 0–99)
Total CHOL/HDL Ratio: 6.5 RATIO
Triglycerides: 507 mg/dL — ABNORMAL HIGH (ref <150)
VLDL: UNDETERMINED mg/dL (ref 0–40)

## 2022-02-02 LAB — GLUCOSE, CAPILLARY
Glucose-Capillary: 111 mg/dL — ABNORMAL HIGH (ref 70–99)
Glucose-Capillary: 135 mg/dL — ABNORMAL HIGH (ref 70–99)
Glucose-Capillary: 144 mg/dL — ABNORMAL HIGH (ref 70–99)
Glucose-Capillary: 149 mg/dL — ABNORMAL HIGH (ref 70–99)
Glucose-Capillary: 178 mg/dL — ABNORMAL HIGH (ref 70–99)
Glucose-Capillary: 183 mg/dL — ABNORMAL HIGH (ref 70–99)
Glucose-Capillary: 192 mg/dL — ABNORMAL HIGH (ref 70–99)
Glucose-Capillary: 199 mg/dL — ABNORMAL HIGH (ref 70–99)
Glucose-Capillary: 210 mg/dL — ABNORMAL HIGH (ref 70–99)
Glucose-Capillary: 212 mg/dL — ABNORMAL HIGH (ref 70–99)
Glucose-Capillary: 82 mg/dL (ref 70–99)
Glucose-Capillary: 83 mg/dL (ref 70–99)

## 2022-02-02 LAB — CBC
HCT: 38.9 % (ref 36.0–46.0)
Hemoglobin: 13.3 g/dL (ref 12.0–15.0)
MCH: 28.2 pg (ref 26.0–34.0)
MCHC: 34.2 g/dL (ref 30.0–36.0)
MCV: 82.6 fL (ref 80.0–100.0)
Platelets: 222 10*3/uL (ref 150–400)
RBC: 4.71 MIL/uL (ref 3.87–5.11)
RDW: 16 % — ABNORMAL HIGH (ref 11.5–15.5)
WBC: 7.6 10*3/uL (ref 4.0–10.5)
nRBC: 0 % (ref 0.0–0.2)

## 2022-02-02 LAB — BASIC METABOLIC PANEL
Anion gap: 5 (ref 5–15)
Anion gap: 7 (ref 5–15)
BUN: <5 mg/dL — ABNORMAL LOW (ref 6–20)
BUN: <5 mg/dL — ABNORMAL LOW (ref 6–20)
CO2: 20 mmol/L — ABNORMAL LOW (ref 22–32)
CO2: 20 mmol/L — ABNORMAL LOW (ref 22–32)
Calcium: 8 mg/dL — ABNORMAL LOW (ref 8.9–10.3)
Calcium: 8.2 mg/dL — ABNORMAL LOW (ref 8.9–10.3)
Chloride: 104 mmol/L (ref 98–111)
Chloride: 105 mmol/L (ref 98–111)
Creatinine, Ser: 0.33 mg/dL — ABNORMAL LOW (ref 0.44–1.00)
Creatinine, Ser: <0.3 mg/dL — ABNORMAL LOW (ref 0.44–1.00)
GFR, Estimated: >60 mL/min (ref >60)
Glucose, Bld: 180 mg/dL — ABNORMAL HIGH (ref 70–99)
Glucose, Bld: 215 mg/dL — ABNORMAL HIGH (ref 70–99)
Potassium: 3.4 mmol/L — ABNORMAL LOW (ref 3.5–5.1)
Potassium: 3.6 mmol/L (ref 3.5–5.1)
Sodium: 130 mmol/L — ABNORMAL LOW (ref 135–145)
Sodium: 131 mmol/L — ABNORMAL LOW (ref 135–145)

## 2022-02-02 LAB — LDL CHOLESTEROL, DIRECT: Direct LDL: 30.4 mg/dL (ref 0–99)

## 2022-02-02 LAB — BETA-HYDROXYBUTYRIC ACID: Beta-Hydroxybutyric Acid: 0.13 mmol/L (ref 0.05–0.27)

## 2022-02-02 MED ORDER — ACETAMINOPHEN 325 MG PO TABS
650.0000 mg | ORAL_TABLET | Freq: Four times a day (QID) | ORAL | Status: DC | PRN
Start: 2022-02-02 — End: 2022-02-06
  Administered 2022-02-02 – 2022-02-05 (×6): 650 mg via ORAL
  Filled 2022-02-02 (×6): qty 2

## 2022-02-02 MED ORDER — INSULIN (MYXREDLIN) INFUSION FOR HYPERTRIGLYCERIDEMIA
0.1000 [IU]/kg/h | INTRAVENOUS | Status: DC
  Administered 2022-02-02 – 2022-02-03 (×3): 0.1 [IU]/kg/h via INTRAVENOUS
  Filled 2022-02-02 (×3): qty 100

## 2022-02-02 MED ORDER — INSULIN ASPART 100 UNIT/ML IJ SOLN
0.0000 [IU] | Freq: Three times a day (TID) | INTRAMUSCULAR | Status: DC
  Administered 2022-02-02: 5 [IU] via SUBCUTANEOUS
  Administered 2022-02-02 (×2): 3 [IU] via SUBCUTANEOUS

## 2022-02-02 MED ORDER — INSULIN ASPART 100 UNIT/ML IJ SOLN: 0.0000 [IU] | Freq: Three times a day (TID) | INTRAMUSCULAR | Status: DC

## 2022-02-02 MED ORDER — DEXTROSE 5 % IV SOLN: INTRAVENOUS | Status: DC

## 2022-02-02 MED ORDER — INSULIN ASPART 100 UNIT/ML IJ SOLN: 0.0000 [IU] | Freq: Every day | INTRAMUSCULAR | Status: DC

## 2022-02-02 MED ORDER — POTASSIUM CHLORIDE CRYS ER 20 MEQ PO TBCR
40.0000 meq | EXTENDED_RELEASE_TABLET | Freq: Once | ORAL | Status: AC
  Administered 2022-02-02: 40 meq via ORAL
  Filled 2022-02-02: qty 2

## 2022-02-02 NOTE — Progress Notes (Signed)
PROGRESS NOTE    Rita Charles  YPP:[id number] DOB: 1975-11-16 DOA: 02/01/2022 PCP: Patient, No Pcp Per    Brief Narrative:  46 year old female with no significant past medical history, admitted to the hospital with abdominal pain.  Found to have acute pancreatitis likely precipitated by hypertriglyceridemia.  She was also noted to be in diabetic ketoacidosis with new onset diabetes.  Admitted for IV fluids and IV insulin.   Assessment & Plan:   Principal Problem:   DKA (diabetic ketoacidosis) (HCC) Active Problems:   Acute pancreatitis   Acute pancreatitis likely induced by hypertriglyceridemia -No history of alcohol or biliary stones -No obvious abnormality in biliary tree/gallbladder on CT imaging -Initial triglyceride level was unable to report due to lipemic interference.  Triglyceride from this morning noted to be elevated at 1152 -Continue supportive management with IV fluids and pain management -She is currently on Dilaudid PCA -We will continue on IV insulin to assist with management of triglyceridemia -Monitor triglycerides every 12 hours, would continue IV insulin until triglyceride level less than 500 -Start on clear liquids  Diabetic ketoacidosis -New onset diabetes -A1c of 13 -Overall anion gap has closed -From a diabetes standpoint, she is able to come off insulin infusion, but she will be continued on IV insulin for hypertriglyceridemia -Diabetic coordinator consult  Hypokalemia -Replace   DVT prophylaxis: enoxaparin (LOVENOX) injection 40 mg Start: 02/01/22 1200  Code Status: Full code Family Communication: Discussed with husband at the bedside Disposition Plan: Status is: Inpatient Remains inpatient appropriate because: Continued management of pancreatitis.  She is on IV insulin and PCA     Consultants:    Procedures:    Antimicrobials:      Subjective: She is still having abdominal pain although it is better than yesterday.  She would  like to drink some water.  She has not had any vomiting.  Objective: Vitals:   02/02/22 1126 02/02/22 1130 02/02/22 1150 02/02/22 1200  BP:  121/77  126/79  Pulse:  98  97  Resp:  (!) 23 (!) 25 (!) 23  Temp: 98.6 F (37 C)     TempSrc: Axillary     SpO2:  94% 99% 93%  Weight:      Height:        Intake/Output Summary (Last 24 hours) at 02/02/2022 1229 Last data filed at 02/02/2022 0924 Gross per 24 hour  Intake 2548.59 ml  Output 1100 ml  Net 1448.59 ml   Filed Weights   02/01/22 0626 02/01/22 1105  Weight: 77.1 kg 81 kg    Examination:  General exam: Appears calm and comfortable  Respiratory system: Clear to auscultation. Respiratory effort normal. Cardiovascular system: S1 & S2 heard, RRR. No JVD, murmurs, rubs, gallops or clicks. No pedal edema. Gastrointestinal system: Abdomen is nondistended, soft and diffusely tender. No organomegaly or masses felt. Normal bowel sounds heard. Central nervous system: Alert and oriented. No focal neurological deficits. Extremities: Symmetric 5 x 5 power. Skin: No rashes, lesions or ulcers Psychiatry: Judgement and insight appear normal. Mood & affect appropriate.     Data Reviewed: I have personally reviewed following labs and imaging studies  CBC: Recent Labs  Lab 02/01/22 0646 02/02/22 0405  WBC 14.1* 7.6  NEUTROABS 12.7*  --   HGB 14.3 13.3  HCT 42.2 38.9  MCV 82.1 82.6  PLT 290 222   Basic Metabolic Panel: Recent Labs  Lab 02/01/22 1147 02/01/22 1548 02/01/22 1947 02/01/22 2338 02/02/22 0405  NA 131* 129* 130* 130* 131*  K 3.6 3.3* 3.6 3.6 3.4*  CL 107 104 104 105 104  CO2 14* 16* 19* 20* 20*  GLUCOSE 202* 210* 178* 180* 215*  BUN <5* <5* <5* <5* <5*  CREATININE 0.36* 0.40* <0.30* <0.30* 0.33*  CALCIUM 8.1* 8.1* 8.1* 8.0* 8.2*   GFR: Estimated Creatinine Clearance: 93.4 mL/min (A) (by C-G formula based on SCr of 0.33 mg/dL (L)). Liver Function Tests: Recent Labs  Lab 02/01/22 0646  AST 21  ALT 31   ALKPHOS 144*  BILITOT 1.2  PROT 8.0  ALBUMIN 4.6   Recent Labs  Lab 02/01/22 0646  LIPASE 99*   No results for input(s): "AMMONIA" in the last 168 hours. Coagulation Profile: No results for input(s): "INR", "PROTIME" in the last 168 hours. Cardiac Enzymes: No results for input(s): "CKTOTAL", "CKMB", "CKMBINDEX", "TROPONINI" in the last 168 hours. BNP (last 3 results) No results for input(s): "PROBNP" in the last 8760 hours. HbA1C: Recent Labs    02/01/22 1012  HGBA1C 13.0*   CBG: Recent Labs  Lab 02/01/22 2311 02/02/22 0011 02/02/22 0205 02/02/22 0737 02/02/22 1141  GLUCAP 199* 199* 183* 210* 178*   Lipid Profile: Recent Labs    02/01/22 1644 02/02/22 0429  CHOL 136  --   HDL 21*  --   LDLCALC UNABLE TO CALCULATE IF TRIGLYCERIDE OVER 400 mg/dL  --   TRIG 109* 3,235*  CHOLHDL 6.5  --    Thyroid Function Tests: No results for input(s): "TSH", "T4TOTAL", "FREET4", "T3FREE", "THYROIDAB" in the last 72 hours. Anemia Panel: No results for input(s): "VITAMINB12", "FOLATE", "FERRITIN", "TIBC", "IRON", "RETICCTPCT" in the last 72 hours. Sepsis Labs: No results for input(s): "PROCALCITON", "LATICACIDVEN" in the last 168 hours.  Recent Results (from the past 240 hour(s))  MRSA Next Gen by PCR, Nasal     Status: None   Collection Time: 02/01/22 11:10 AM   Specimen: Nasal Mucosa; Nasal Swab  Result Value Ref Range Status   MRSA by PCR Next Gen NOT DETECTED NOT DETECTED Final    Comment: (NOTE) The GeneXpert MRSA Assay (FDA approved for NASAL specimens only), is one component of a comprehensive MRSA colonization surveillance program. It is not intended to diagnose MRSA infection nor to guide or monitor treatment for MRSA infections. Test performance is not FDA approved in patients less than 80 years old. Performed at [facility], [address], [place], [place] [address]          Radiology Studies: CT ABDOMEN PELVIS W CONTRAST  Result Date:  02/01/2022 CLINICAL DATA:  Abdominal pain. EXAM: CT ABDOMEN AND PELVIS WITH CONTRAST TECHNIQUE: Multidetector CT imaging of the abdomen and pelvis was performed using the standard protocol following bolus administration of intravenous contrast. RADIATION DOSE REDUCTION: This exam was performed according to the departmental dose-optimization program which includes automated exposure control, adjustment of the mA and/or kV according to patient size and/or use of iterative reconstruction technique. CONTRAST:  OMNIPAQUE IOHEXOL 300 MG/ML  SOLN COMPARISON:  03/25/2018 FINDINGS: Lower chest: Subsegmental atelectasis noted in the lung bases. Hepatobiliary: No suspicious focal abnormality within the liver parenchyma. There is no evidence for gallstones, gallbladder wall thickening, or pericholecystic fluid. No intrahepatic or extrahepatic biliary dilation. Pancreas: There is edema in the parenchyma of the pancreatic body and tail with fairly substantial peripancreatic edema/inflammation around the body and tail of pancreas. No main duct dilatation. Pancreatic parenchyma enhances throughout. Spleen: No splenomegaly. No focal mass lesion. Adrenals/Urinary Tract: No adrenal nodule or mass. Kidneys unremarkable. No evidence for  hydroureter. The urinary bladder appears normal for the degree of distention. Stomach/Bowel: Stomach is unremarkable. No gastric wall thickening. No evidence of outlet obstruction. Duodenum is normally positioned as is the ligament of Treitz. No small bowel wall thickening. No small bowel dilatation. The terminal ileum is normal. Nonvisualization of the appendix is consistent with the reported history of appendectomy. No gross colonic mass. No colonic wall thickening. Vascular/Lymphatic: No abdominal aortic aneurysm. No abdominal aortic atherosclerotic calcification. Portal vein and splenic vein are patent. There is no gastrohepatic or hepatoduodenal ligament lymphadenopathy. No retroperitoneal or  mesenteric lymphadenopathy. No pelvic sidewall lymphadenopathy. Reproductive: The uterus is unremarkable.  There is no adnexal mass. Other: No intraperitoneal free fluid. Musculoskeletal: No worrisome lytic or sclerotic osseous abnormality. IMPRESSION: Edema in the parenchyma of the pancreatic body and tail with fairly substantial peripancreatic edema/inflammation. Imaging features consistent with acute pancreatitis. No evidence for pancreatic necrosis or pseudocyst. No vascular complication. No main duct dilatation. Electronically Signed   By: Kennith Center M.D.   On: 02/01/2022 09:30        Scheduled Meds:  Chlorhexidine Gluconate Cloth  6 each Topical Daily   enoxaparin (LOVENOX) injection  40 mg Subcutaneous Q24H   HYDROmorphone   Intravenous Q4H   insulin aspart  0-15 Units Subcutaneous TID WC   insulin aspart  0-5 Units Subcutaneous QHS   pantoprazole (PROTONIX) IV  40 mg Intravenous Q24H   potassium chloride  40 mEq Oral Once   Continuous Infusions:  dextrose 5% lactated ringers 75 mL/hr at 02/02/22 0924   dextrose     insulin     lactated ringers Stopped (02/01/22 1323)     LOS: 1 day    Time spent:    Erick Blinks, MD [facility]   If 7PM-7AM, please contact night-coverage [email or web address]  02/02/2022, 12:29 PM

## 2022-02-03 LAB — GLUCOSE, CAPILLARY
Glucose-Capillary: 108 mg/dL — ABNORMAL HIGH (ref 70–99)
Glucose-Capillary: 110 mg/dL — ABNORMAL HIGH (ref 70–99)
Glucose-Capillary: 156 mg/dL — ABNORMAL HIGH (ref 70–99)
Glucose-Capillary: 171 mg/dL — ABNORMAL HIGH (ref 70–99)
Glucose-Capillary: 54 mg/dL — ABNORMAL LOW (ref 70–99)
Glucose-Capillary: 65 mg/dL — ABNORMAL LOW (ref 70–99)
Glucose-Capillary: 69 mg/dL — ABNORMAL LOW (ref 70–99)
Glucose-Capillary: 70 mg/dL (ref 70–99)
Glucose-Capillary: 70 mg/dL (ref 70–99)
Glucose-Capillary: 73 mg/dL (ref 70–99)
Glucose-Capillary: 78 mg/dL (ref 70–99)
Glucose-Capillary: 79 mg/dL (ref 70–99)
Glucose-Capillary: 79 mg/dL (ref 70–99)
Glucose-Capillary: 80 mg/dL (ref 70–99)
Glucose-Capillary: 80 mg/dL (ref 70–99)
Glucose-Capillary: 81 mg/dL (ref 70–99)
Glucose-Capillary: 83 mg/dL (ref 70–99)
Glucose-Capillary: 92 mg/dL (ref 70–99)
Glucose-Capillary: 96 mg/dL (ref 70–99)

## 2022-02-03 LAB — BASIC METABOLIC PANEL
Anion gap: 5 (ref 5–15)
BUN: <5 mg/dL — ABNORMAL LOW (ref 6–20)
CO2: 24 mmol/L (ref 22–32)
Calcium: 8.7 mg/dL — ABNORMAL LOW (ref 8.9–10.3)
Chloride: 106 mmol/L (ref 98–111)
Creatinine, Ser: <0.3 mg/dL — ABNORMAL LOW (ref 0.44–1.00)
Glucose, Bld: 63 mg/dL — ABNORMAL LOW (ref 70–99)
Potassium: 3.1 mmol/L — ABNORMAL LOW (ref 3.5–5.1)
Sodium: 135 mmol/L (ref 135–145)

## 2022-02-03 LAB — MAGNESIUM: Magnesium: 1.9 mg/dL (ref 1.7–2.4)

## 2022-02-03 LAB — TRIGLYCERIDES
Triglycerides: 544 mg/dL — ABNORMAL HIGH (ref <150)
Triglycerides: 519 mg/dL — ABNORMAL HIGH (ref <150)

## 2022-02-03 MED ORDER — POTASSIUM CHLORIDE CRYS ER 20 MEQ PO TBCR
40.0000 meq | EXTENDED_RELEASE_TABLET | ORAL | Status: AC
  Administered 2022-02-03 (×3): 40 meq via ORAL
  Filled 2022-02-03 (×3): qty 2

## 2022-02-03 MED ORDER — LIVING WELL WITH DIABETES BOOK: Freq: Once | Status: AC

## 2022-02-03 MED ORDER — KCL IN DEXTROSE-NACL 40-5-0.45 MEQ/L-%-% IV SOLN: INTRAVENOUS | Status: DC

## 2022-02-03 MED ORDER — LACTATED RINGERS IV BOLUS
1000.0000 mL | INTRAVENOUS | Status: AC
  Administered 2022-02-03 (×2): 1000 mL via INTRAVENOUS

## 2022-02-03 MED ORDER — GEMFIBROZIL 600 MG PO TABS
600.0000 mg | ORAL_TABLET | Freq: Two times a day (BID) | ORAL | Status: DC
  Administered 2022-02-03 – 2022-02-06 (×7): 600 mg via ORAL
  Filled 2022-02-03 (×7): qty 1

## 2022-02-03 NOTE — Progress Notes (Signed)
PROGRESS NOTE    Rita Charles  OZD:[id number] DOB: 18-Jun-1976 DOA: 02/01/2022 PCP: Patient, No Pcp Per    Brief Narrative:  46 year old female with no significant past medical history, admitted to the hospital with abdominal pain.  Found to have acute pancreatitis likely precipitated by hypertriglyceridemia.  She was also noted to be in diabetic ketoacidosis with new onset diabetes.  Admitted for IV fluids and IV insulin.   Assessment & Plan:   Principal Problem:   DKA (diabetic ketoacidosis) (HCC) Active Problems:   Acute pancreatitis   Hypertriglyceridemia   Acute pancreatitis likely induced by hypertriglyceridemia -No history of alcohol or biliary stones -No obvious abnormality in biliary tree/gallbladder on CT imaging -Initial triglyceride level was unable to report due to lipemic interference.  Triglyceride from this morning noted to be elevated at 1152 -Continue supportive management with IV fluids and pain management -She is currently on Dilaudid PCA -We will continue on IV insulin to assist with management of triglyceridemia -Monitor triglycerides every 12 hours, would continue IV insulin until triglyceride level less than 500 -Start on clear liquids -Started on gemfibrozil  Diabetic ketoacidosis -New onset diabetes -A1c of 13 -Overall anion gap has closed -From a diabetes standpoint, she is able to come off insulin infusion, but she will be continued on IV insulin for hypertriglyceridemia -Diabetic coordinator consult  Hypokalemia -Replace   DVT prophylaxis: enoxaparin (LOVENOX) injection 40 mg Start: 02/01/22 1200  Code Status: Full code Family Communication: Discussed with husband at the bedside Disposition Plan: Status is: Inpatient Remains inpatient appropriate because: Continued management of pancreatitis.  She is on IV insulin and PCA     Consultants:    Procedures:    Antimicrobials:      Subjective: Continues to have significant  abdominal pain, still requiring PCA.  She has not had any vomiting.  She is tolerating small amounts of clear liquids.  Objective: Vitals:   02/03/22 1626 02/03/22 1629 02/03/22 1700 02/03/22 1800  BP:   124/75 128/90  Pulse: 89 98 88 80  Resp: (!) 22 (!) 23 (!) 23 19  Temp: 99 F (37.2 C)     TempSrc: Oral     SpO2: 97% 95% 96% 97%  Weight:      Height:        Intake/Output Summary (Last 24 hours) at 02/03/2022 1857 Last data filed at 02/03/2022 1813 Gross per 24 hour  Intake 2842.13 ml  Output 2900 ml  Net -57.87 ml   Filed Weights   02/01/22 0626 02/01/22 1105  Weight: 77.1 kg 81 kg    Examination:  General exam: Appears calm and comfortable  Respiratory system: Clear to auscultation. Respiratory effort normal. Cardiovascular system: S1 & S2 heard, RRR. No JVD, murmurs, rubs, gallops or clicks. No pedal edema. Gastrointestinal system: Abdomen is nondistended, soft and diffusely tender. No organomegaly or masses felt. Normal bowel sounds heard. Central nervous system: Alert and oriented. No focal neurological deficits. Extremities: Symmetric 5 x 5 power. Skin: No rashes, lesions or ulcers Psychiatry: Judgement and insight appear normal. Mood & affect appropriate.     Data Reviewed: I have personally reviewed following labs and imaging studies  CBC: Recent Labs  Lab 02/01/22 0646 02/02/22 0405  WBC 14.1* 7.6  NEUTROABS 12.7*  --   HGB 14.3 13.3  HCT 42.2 38.9  MCV 82.1 82.6  PLT 290 222   Basic Metabolic Panel: Recent Labs  Lab 02/01/22 1548 02/01/22 1947 02/01/22 2338 02/02/22 0405 02/03/22 0337  NA 129*  130* 130* 131* 135  K 3.3* 3.6 3.6 3.4* 3.1*  CL 104 104 105 104 106  CO2 16* 19* 20* 20* 24  GLUCOSE 210* 178* 180* 215* 63*  BUN <5* <5* <5* <5* <5*  CREATININE 0.40* <0.30* <0.30* 0.33* <0.30*  CALCIUM 8.1* 8.1* 8.0* 8.2* 8.7*  MG  --   --   --   --  1.9   GFR: CrCl cannot be calculated (This lab value cannot be used to calculate CrCl  because it is not a number: <0.30). Liver Function Tests: Recent Labs  Lab 02/01/22 0646  AST 21  ALT 31  ALKPHOS 144*  BILITOT 1.2  PROT 8.0  ALBUMIN 4.6   Recent Labs  Lab 02/01/22 0646  LIPASE 99*   No results for input(s): "AMMONIA" in the last 168 hours. Coagulation Profile: No results for input(s): "INR", "PROTIME" in the last 168 hours. Cardiac Enzymes: No results for input(s): "CKTOTAL", "CKMB", "CKMBINDEX", "TROPONINI" in the last 168 hours. BNP (last 3 results) No results for input(s): "PROBNP" in the last 8760 hours. HbA1C: Recent Labs    02/01/22 1012  HGBA1C 13.0*   CBG: Recent Labs  Lab 02/03/22 1404 02/03/22 1505 02/03/22 1558 02/03/22 1727 02/03/22 1828  GLUCAP 65* 96 70 73 54*   Lipid Profile: Recent Labs    02/01/22 1644 02/02/22 0429 02/03/22 0337 02/03/22 1725  CHOL 136  --   --   --   HDL 21*  --   --   --   LDLCALC UNABLE TO CALCULATE IF TRIGLYCERIDE OVER 400 mg/dL  --   --   --   TRIG 507*   < > 544* 519*  CHOLHDL 6.5  --   --   --   LDLDIRECT 30.4  --   --   --    < > = values in this interval not displayed.   Thyroid Function Tests: No results for input(s): "TSH", "T4TOTAL", "FREET4", "T3FREE", "THYROIDAB" in the last 72 hours. Anemia Panel: No results for input(s): "VITAMINB12", "FOLATE", "FERRITIN", "TIBC", "IRON", "RETICCTPCT" in the last 72 hours. Sepsis Labs: No results for input(s): "PROCALCITON", "LATICACIDVEN" in the last 168 hours.  Recent Results (from the past 240 hour(s))  MRSA Next Gen by PCR, Nasal     Status: None   Collection Time: 02/01/22 11:10 AM   Specimen: Nasal Mucosa; Nasal Swab  Result Value Ref Range Status   MRSA by PCR Next Gen NOT DETECTED NOT DETECTED Final    Comment: (NOTE) The GeneXpert MRSA Assay (FDA approved for NASAL specimens only), is one component of a comprehensive MRSA colonization surveillance program. It is not intended to diagnose MRSA infection nor to guide or monitor  treatment for MRSA infections. Test performance is not FDA approved in patients less than 21 years old. Performed at [facility], [address] [place], [place] [address]          Radiology Studies: No results found.      Scheduled Meds:  Chlorhexidine Gluconate Cloth  6 each Topical Daily   enoxaparin (LOVENOX) injection  40 mg Subcutaneous Q24H   gemfibrozil  600 mg Oral BID AC   HYDROmorphone   Intravenous Q4H   living well with diabetes book   Does not apply Once   pantoprazole (PROTONIX) IV  40 mg Intravenous Q24H   Continuous Infusions:  dextrose 5 % and 0.45 % NaCl with KCl 40 mEq/L 125 mL/hr at 02/03/22 1244   insulin 0.1 Units/kg/hr (02/03/22 1437)  lactated ringers       LOS: 2 days    Time spent:    Erick Blinks, MD [facility]   If 7PM-7AM, please contact night-coverage [email or web address]  02/03/2022, 6:57 PM

## 2022-02-03 NOTE — Inpatient Diabetes Management (Signed)
Inpatient Diabetes Program Recommendations  AACE/ADA: New Consensus Statement on Inpatient Glycemic Control (2015)  Target Ranges:  Prepandial:   less than 140 mg/dL      Peak postprandial:   less than 180 mg/dL (1-2 hours)      Critically ill patients:  140 - 180 mg/dL   Lab Results  Component Value Date   GLUCAP 70 02/03/2022   HGBA1C 13.0 (H) 02/01/2022    Review of Glycemic Control  Diabetes history: New Diagnosis  Current orders for Inpatient glycemic control:  IV insulin for Triglycerides  Inpatient Diabetes Program Recommendations:    At time of transition consider  -  Semglee 15 units -  Novolog 0-9 units tid + hs  Spoke with husband briefly over the phone regarding A1c level and new diagnosis of Diabetes. Introduced the need for insulin at time of d/c. Will send video links to email for them to view, have ordered the living well with Diabetes Educational booklet. RN staff to teach insulin administration with vial and syringe and CBG checks when pt is on SQ therapy.  Will speak with patient and husband tomorrow am and review in depth Diabetes Education. And insulin administration.  Pt without insurance may need [employer] insulin 70/30 bid at time of d/c. TOC consult placed.   Thanks,  Christena Deem RN, MSN, BC-ADM Inpatient Diabetes Coordinator Team Pager [phone] (8a-5p)

## 2022-02-03 NOTE — TOC Progression Note (Addendum)
  Transition of Care [facility]) Screening Note   Patient Details  Name: Rita Charles Date of Birth: June 05, 1976   Transition of Care [facility]) CM/SW Contact:    Leitha Bleak, RN Phone Number: 02/03/2022, 2:50 PM    Transition of Care Department [facility]) has reviewed patient and no TOC needs have been identified at this time. We will continue to monitor patient advancement through interdisciplinary progression rounds. If new patient transition needs arise, please place a TOC consult.    Expected Discharge Plan: Home/Self Care Barriers to Discharge: Continued Medical Work up  Expected Discharge Plan and Services Expected Discharge Plan: Home/Self Care      Living arrangements for the past 2 months: Single Family Home

## 2022-02-04 DIAGNOSIS — E039 Hypothyroidism, unspecified: Secondary | ICD-10-CM

## 2022-02-04 LAB — GLUCOSE, CAPILLARY
Glucose-Capillary: 108 mg/dL — ABNORMAL HIGH (ref 70–99)
Glucose-Capillary: 121 mg/dL — ABNORMAL HIGH (ref 70–99)
Glucose-Capillary: 145 mg/dL — ABNORMAL HIGH (ref 70–99)
Glucose-Capillary: 147 mg/dL — ABNORMAL HIGH (ref 70–99)
Glucose-Capillary: 162 mg/dL — ABNORMAL HIGH (ref 70–99)
Glucose-Capillary: 168 mg/dL — ABNORMAL HIGH (ref 70–99)
Glucose-Capillary: 170 mg/dL — ABNORMAL HIGH (ref 70–99)
Glucose-Capillary: 172 mg/dL — ABNORMAL HIGH (ref 70–99)
Glucose-Capillary: 173 mg/dL — ABNORMAL HIGH (ref 70–99)
Glucose-Capillary: 183 mg/dL — ABNORMAL HIGH (ref 70–99)
Glucose-Capillary: 188 mg/dL — ABNORMAL HIGH (ref 70–99)
Glucose-Capillary: 194 mg/dL — ABNORMAL HIGH (ref 70–99)
Glucose-Capillary: 51 mg/dL — ABNORMAL LOW (ref 70–99)
Glucose-Capillary: 69 mg/dL — ABNORMAL LOW (ref 70–99)
Glucose-Capillary: 81 mg/dL (ref 70–99)

## 2022-02-04 LAB — TRIGLYCERIDES
Triglycerides: 538 mg/dL — ABNORMAL HIGH (ref <150)
Triglycerides: 570 mg/dL — ABNORMAL HIGH (ref <150)

## 2022-02-04 LAB — CBC
HCT: 36.6 % (ref 36.0–46.0)
Hemoglobin: 11.7 g/dL — ABNORMAL LOW (ref 12.0–15.0)
MCH: 27.5 pg (ref 26.0–34.0)
MCHC: 32 g/dL (ref 30.0–36.0)
MCV: 86.1 fL (ref 80.0–100.0)
Platelets: 249 10*3/uL (ref 150–400)
RBC: 4.25 MIL/uL (ref 3.87–5.11)
RDW: 16.6 % — ABNORMAL HIGH (ref 11.5–15.5)
WBC: 10.1 10*3/uL (ref 4.0–10.5)
nRBC: 0 % (ref 0.0–0.2)

## 2022-02-04 LAB — BASIC METABOLIC PANEL
Anion gap: 5 (ref 5–15)
BUN: <5 mg/dL — ABNORMAL LOW (ref 6–20)
CO2: 25 mmol/L (ref 22–32)
Calcium: 8.9 mg/dL (ref 8.9–10.3)
Chloride: 105 mmol/L (ref 98–111)
Creatinine, Ser: 0.41 mg/dL — ABNORMAL LOW (ref 0.44–1.00)
GFR, Estimated: >60 mL/min (ref >60)
Glucose, Bld: 83 mg/dL (ref 70–99)
Potassium: 4.3 mmol/L (ref 3.5–5.1)
Sodium: 135 mmol/L (ref 135–145)

## 2022-02-04 LAB — T4, FREE: Free T4: 0.69 ng/dL (ref 0.61–1.12)

## 2022-02-04 LAB — TSH: TSH: 19.068 u[IU]/mL — ABNORMAL HIGH (ref 0.350–4.500)

## 2022-02-04 MED ORDER — HYDROMORPHONE HCL 1 MG/ML IJ SOLN
0.5000 mg | INTRAMUSCULAR | Status: DC | PRN
  Administered 2022-02-05 – 2022-02-06 (×4): 0.5 mg via INTRAVENOUS
  Filled 2022-02-04 (×4): qty 0.5

## 2022-02-04 MED ORDER — INSULIN GLARGINE-YFGN 100 UNIT/ML ~~LOC~~ SOLN
10.0000 [IU] | Freq: Every day | SUBCUTANEOUS | Status: DC
  Administered 2022-02-04 – 2022-02-05 (×2): 10 [IU] via SUBCUTANEOUS
  Filled 2022-02-04 (×4): qty 0.1

## 2022-02-04 MED ORDER — LACTATED RINGERS IV SOLN: INTRAVENOUS | Status: DC

## 2022-02-04 MED ORDER — OXYCODONE HCL 5 MG PO TABS
5.0000 mg | ORAL_TABLET | Freq: Four times a day (QID) | ORAL | Status: DC
  Administered 2022-02-04 – 2022-02-05 (×5): 5 mg via ORAL
  Filled 2022-02-04 (×6): qty 1

## 2022-02-04 MED ORDER — DEXTROSE 50 % IV SOLN
1.0000 | Freq: Once | INTRAVENOUS | Status: AC
  Administered 2022-02-04: 50 mL via INTRAVENOUS

## 2022-02-04 MED ORDER — INSULIN ASPART 100 UNIT/ML IJ SOLN
0.0000 [IU] | INTRAMUSCULAR | Status: DC
  Administered 2022-02-04 – 2022-02-05 (×9): 3 [IU] via SUBCUTANEOUS
  Administered 2022-02-06 (×2): 2 [IU] via SUBCUTANEOUS
  Administered 2022-02-06 (×2): 3 [IU] via SUBCUTANEOUS

## 2022-02-04 NOTE — Progress Notes (Signed)
PCA was stopped by this RN at [facility] [facility]. Wardell Heath Gerrianne Scale

## 2022-02-04 NOTE — Progress Notes (Signed)
PROGRESS NOTE    Rita Charles  ZOX:[id number] DOB: [birth year] DOA: 02/01/2022 PCP: Patient, No Pcp Per    Brief Narrative:  46 year old female with no significant past medical history, admitted to the hospital with abdominal pain.  Found to have acute pancreatitis likely precipitated by hypertriglyceridemia.  She was also noted to be in diabetic ketoacidosis with new onset diabetes.  Admitted for IV fluids and IV insulin.   Assessment & Plan:   Principal Problem:   DKA (diabetic ketoacidosis) (HCC) Active Problems:   Acute pancreatitis   Hypertriglyceridemia   Acute pancreatitis likely induced by hypertriglyceridemia -No history of alcohol or biliary stones -No obvious abnormality in biliary tree/gallbladder on CT imaging -Initial triglyceride level was unable to report due to lipemic interference.  Subsequent Triglyceride noted to be elevated at 1152 -Continue supportive management with IV fluids and pain management -She was treated with IV insulin for triglycerides -due to recurrent hypoglycemia despite dextrose infusion, she was transitioned off IV insulin and started on basal insulin -Started on clear liquids, will not advance yet since she has continued pain -Started on gemfibrozil -she was receiving dilaudid PCA with suboptimal control of pain -started on scheduled oxycodone and will give dilaudid as needed for severe breakthrough pain  Diabetic ketoacidosis -New onset diabetes -A1c of 13 -Overall anion gap has closed -She is being transitioned to basal/SSI insulin -Diabetic coordinator consult  Hypothyroidism -TSH 19 -checking T3, Free T4  Hypokalemia -Replace   DVT prophylaxis: enoxaparin (LOVENOX) injection 40 mg Start: 02/01/22 1200  Code Status: Full code Family Communication: Discussed with husband at the bedside Disposition Plan: Status is: Inpatient Remains inpatient appropriate because: Continued management of pancreatitis.  She will need  referral to endocrinology at discharge    Consultants:    Procedures:    Antimicrobials:      Subjective: Patient continues to have significant abdominal pain. She has not had any vomiting. She is taking in very small amounts of clear liquids.   Objective: Vitals:   02/04/22 1300 02/04/22 1500 02/04/22 1558 02/04/22 1700  BP: 135/85 126/88  136/87  Pulse: 86 83 83 95  Resp: (!) 35 (!) 22 (!) 22 (!) 24  Temp:   98.3 F (36.8 C)   TempSrc:   Oral   SpO2: 100% 98% 96% 98%  Weight:      Height:        Intake/Output Summary (Last 24 hours) at 02/04/2022 1959 Last data filed at 02/04/2022 1852 Gross per 24 hour  Intake 4350.35 ml  Output 2000 ml  Net 2350.35 ml   Filed Weights   02/01/22 0626 02/01/22 1105 02/04/22 0500  Weight: 77.1 kg 81 kg 81.1 kg    Examination:  General exam: Appears calm and comfortable  Respiratory system: Clear to auscultation. Respiratory effort normal. Cardiovascular system: S1 & S2 heard, RRR. No JVD, murmurs, rubs, gallops or clicks. No pedal edema. Gastrointestinal system: Abdomen is distended, soft and diffusely tender. No organomegaly or masses felt. Normal bowel sounds heard. Central nervous system: Alert and oriented. No focal neurological deficits. Extremities: Symmetric 5 x 5 power. Skin: No rashes, lesions or ulcers Psychiatry: Judgement and insight appear normal. Mood & affect appropriate.     Data Reviewed: I have personally reviewed following labs and imaging studies  CBC: Recent Labs  Lab 02/01/22 0646 02/02/22 0405 02/04/22 0352  WBC 14.1* 7.6 10.1  NEUTROABS 12.7*  --   --   HGB 14.3 13.3 11.7*  HCT 42.2 38.9 36.6  MCV 82.1 82.6 86.1  PLT 290 222 249   Basic Metabolic Panel: Recent Labs  Lab 02/01/22 1947 02/01/22 2338 02/02/22 0405 02/03/22 0337 02/04/22 0352  NA 130* 130* 131* 135 135  K 3.6 3.6 3.4* 3.1* 4.3  CL 104 105 104 106 105  CO2 19* 20* 20* 24 25  GLUCOSE 178* 180* 215* 63* 83  BUN <5*  <5* <5* <5* <5*  CREATININE <0.30* <0.30* 0.33* <0.30* 0.41*  CALCIUM 8.1* 8.0* 8.2* 8.7* 8.9  MG  --   --   --  1.9  --    GFR: Estimated Creatinine Clearance: 93.4 mL/min (A) (by C-G formula based on SCr of 0.41 mg/dL (L)). Liver Function Tests: Recent Labs  Lab 02/01/22 0646  AST 21  ALT 31  ALKPHOS 144*  BILITOT 1.2  PROT 8.0  ALBUMIN 4.6   Recent Labs  Lab 02/01/22 0646  LIPASE 99*   No results for input(s): "AMMONIA" in the last 168 hours. Coagulation Profile: No results for input(s): "INR", "PROTIME" in the last 168 hours. Cardiac Enzymes: No results for input(s): "CKTOTAL", "CKMB", "CKMBINDEX", "TROPONINI" in the last 168 hours. BNP (last 3 results) No results for input(s): "PROBNP" in the last 8760 hours. HbA1C: No results for input(s): "HGBA1C" in the last 72 hours.  CBG: Recent Labs  Lab 02/04/22 1217 02/04/22 1303 02/04/22 1403 02/04/22 1530 02/04/22 1630  GLUCAP 173* 145* 147* 172* 170*   Lipid Profile: Recent Labs    02/04/22 0352 02/04/22 1721  TRIG 538* 570*   Thyroid Function Tests: Recent Labs    02/04/22 0352 02/04/22 1003  TSH 19.068*  --   FREET4  --  0.69   Anemia Panel: No results for input(s): "VITAMINB12", "FOLATE", "FERRITIN", "TIBC", "IRON", "RETICCTPCT" in the last 72 hours. Sepsis Labs: No results for input(s): "PROCALCITON", "LATICACIDVEN" in the last 168 hours.  Recent Results (from the past 240 hour(s))  MRSA Next Gen by PCR, Nasal     Status: None   Collection Time: 02/01/22 11:10 AM   Specimen: Nasal Mucosa; Nasal Swab  Result Value Ref Range Status   MRSA by PCR Next Gen NOT DETECTED NOT DETECTED Final    Comment: (NOTE) The GeneXpert MRSA Assay (FDA approved for NASAL specimens only), is one component of a comprehensive MRSA colonization surveillance program. It is not intended to diagnose MRSA infection nor to guide or monitor treatment for MRSA infections. Test performance is not FDA approved in patients  less than 15 years old. Performed at [facility], [address], [place], [place] [address]          Radiology Studies: No results found.      Scheduled Meds:  Chlorhexidine Gluconate Cloth  6 each Topical Daily   enoxaparin (LOVENOX) injection  40 mg Subcutaneous Q24H   gemfibrozil  600 mg Oral BID AC   insulin aspart  0-15 Units Subcutaneous Q4H   insulin glargine-yfgn  10 Units Subcutaneous Daily   oxyCODONE  5 mg Oral Q6H   pantoprazole (PROTONIX) IV  40 mg Intravenous Q24H   Continuous Infusions:  insulin Stopped (02/04/22 1125)   lactated ringers 100 mL/hr at 02/04/22 1852     LOS: 3 days    Time spent:    Erick Blinks, MD [facility]   If 7PM-7AM, please contact night-coverage [email or web address]  02/04/2022, 7:59 PM

## 2022-02-04 NOTE — Plan of Care (Signed)
  Problem: Coping: Goal: Ability to adjust to condition or change in health will improve Outcome: Progressing   Problem: Skin Integrity: Goal: Risk for impaired skin integrity will decrease Outcome: Progressing   Problem: Education: Goal: Knowledge of General Education information will improve Description: Including pain rating scale, medication(s)/side effects and non-pharmacologic comfort measures Outcome: Progressing   Problem: Clinical Measurements: Goal: Will remain free from infection Outcome: Progressing   Problem: Coping: Goal: Level of anxiety will decrease Outcome: Progressing   Problem: Pain Managment: Goal: General experience of comfort will improve Outcome: Progressing   Problem: Safety: Goal: Ability to remain free from injury will improve Outcome: Progressing

## 2022-02-04 NOTE — Inpatient Diabetes Management (Signed)
Inpatient Diabetes Program Recommendations  AACE/ADA: New Consensus Statement on Inpatient Glycemic Control (2015)  Target Ranges:  Prepandial:   less than 140 mg/dL      Peak postprandial:   less than 180 mg/dL (1-2 hours)      Critically ill patients:  140 - 180 mg/dL   Lab Results  Component Value Date   GLUCAP 173 (H) 02/04/2022   HGBA1C 13.0 (H) 02/01/2022    Review of Glycemic Control  Diabetes history: New DM this admission  Current orders for Inpatient glycemic control:  Semglee 10 units Daily Novolog 0-15 units Q4 hours  A1c 13% on 7/8  Went to pt room to educate pt on New DM diagnosis and the use of insulin at home. Pt on PCA pump and in pain in the bed. Pt not appropriate for education at this time. Will follow pt and will reassess appropriateness for education tomorrow 7/12.  Thanks,  Christena Deem RN, MSN, BC-ADM Inpatient Diabetes Coordinator Team Pager [phone] (8a-5p)

## 2022-02-04 NOTE — Progress Notes (Signed)
Hypoglycemic Event  CBG: 51  Treatment: D50 50 mL (25 gm)  Symptoms: Sweaty  Follow-up CBG: Time:0605 CBG Result:168  Possible Reasons for Event: Medication regimen: insulin drip  Comments/MD notified: Recheck poc glucose in 15 min     [place]

## 2022-02-05 ENCOUNTER — Inpatient Hospital Stay (HOSPITAL_COMMUNITY): Payer: Self-pay

## 2022-02-05 LAB — CBC
HCT: 37.2 % (ref 36.0–46.0)
Hemoglobin: 11.8 g/dL — ABNORMAL LOW (ref 12.0–15.0)
MCH: 27.3 pg (ref 26.0–34.0)
MCHC: 31.7 g/dL (ref 30.0–36.0)
MCV: 86.1 fL (ref 80.0–100.0)
Platelets: 287 10*3/uL (ref 150–400)
RBC: 4.32 MIL/uL (ref 3.87–5.11)
RDW: 16.3 % — ABNORMAL HIGH (ref 11.5–15.5)
WBC: 9.3 10*3/uL (ref 4.0–10.5)
nRBC: 0 % (ref 0.0–0.2)

## 2022-02-05 LAB — BASIC METABOLIC PANEL
Anion gap: 7 (ref 5–15)
BUN: 5 mg/dL — ABNORMAL LOW (ref 6–20)
CO2: 24 mmol/L (ref 22–32)
Calcium: 8.6 mg/dL — ABNORMAL LOW (ref 8.9–10.3)
Chloride: 103 mmol/L (ref 98–111)
Creatinine, Ser: 0.46 mg/dL (ref 0.44–1.00)
GFR, Estimated: >60 mL/min (ref >60)
Glucose, Bld: 152 mg/dL — ABNORMAL HIGH (ref 70–99)
Potassium: 3.5 mmol/L (ref 3.5–5.1)
Sodium: 134 mmol/L — ABNORMAL LOW (ref 135–145)

## 2022-02-05 LAB — GLUCOSE, CAPILLARY
Glucose-Capillary: 139 mg/dL — ABNORMAL HIGH (ref 70–99)
Glucose-Capillary: 151 mg/dL — ABNORMAL HIGH (ref 70–99)
Glucose-Capillary: 152 mg/dL — ABNORMAL HIGH (ref 70–99)
Glucose-Capillary: 152 mg/dL — ABNORMAL HIGH (ref 70–99)
Glucose-Capillary: 154 mg/dL — ABNORMAL HIGH (ref 70–99)
Glucose-Capillary: 155 mg/dL — ABNORMAL HIGH (ref 70–99)
Glucose-Capillary: 160 mg/dL — ABNORMAL HIGH (ref 70–99)
Glucose-Capillary: 179 mg/dL — ABNORMAL HIGH (ref 70–99)

## 2022-02-05 LAB — TRIGLYCERIDES: Triglycerides: 481 mg/dL — ABNORMAL HIGH (ref <150)

## 2022-02-05 LAB — TROPONIN I (HIGH SENSITIVITY)
Troponin I (High Sensitivity): <2 ng/L (ref <18)
Troponin I (High Sensitivity): <2 ng/L (ref <18)

## 2022-02-05 LAB — T3: T3, Total: 35 ng/dL — ABNORMAL LOW (ref 71–180)

## 2022-02-05 MED ORDER — HYDRALAZINE HCL 20 MG/ML IJ SOLN: 10.0000 mg | INTRAMUSCULAR | Status: DC | PRN

## 2022-02-05 MED ORDER — METOPROLOL TARTRATE 5 MG/5ML IV SOLN: 5.0000 mg | INTRAVENOUS | Status: DC | PRN

## 2022-02-05 MED ORDER — SENNOSIDES-DOCUSATE SODIUM 8.6-50 MG PO TABS: 1.0000 | ORAL_TABLET | Freq: Every evening | ORAL | Status: DC | PRN

## 2022-02-05 MED ORDER — OXYCODONE HCL 5 MG PO TABS: 5.0000 mg | ORAL_TABLET | ORAL | Status: DC | PRN

## 2022-02-05 MED ORDER — IPRATROPIUM-ALBUTEROL 0.5-2.5 (3) MG/3ML IN SOLN: 3.0000 mL | RESPIRATORY_TRACT | Status: DC | PRN

## 2022-02-05 MED ORDER — TRAZODONE HCL 50 MG PO TABS: 50.0000 mg | ORAL_TABLET | Freq: Every evening | ORAL | Status: DC | PRN

## 2022-02-05 MED ORDER — GUAIFENESIN 100 MG/5ML PO LIQD: 5.0000 mL | ORAL | Status: DC | PRN

## 2022-02-05 NOTE — Progress Notes (Signed)
Pt transported to CT. Lindsee Labarre Amylynn Fano,RN 

## 2022-02-05 NOTE — Progress Notes (Signed)
Pt c/o sudden onset left sided chest pain, head, face pain described as sharp and pressure. EKG performed. Normal neuro exam. Vital signs appear stable. Dr Nelson Chimes informed. This RN gave dilaudid for pain. Dr. Nelson Chimes orders head CT and troponin. Rita Charles

## 2022-02-05 NOTE — TOC Initial Note (Signed)
Transition of Care [facility]) - Initial/Assessment Note    Patient Details  Name: Rita Charles MRN: [id number] Date of Birth: Jun 17, 1976  Transition of [facility]) CM/SW Contact:    Leitha Bleak, RN Phone Number: 02/05/2022, 1:47 PM  Clinical Narrative:     Patient admitted with DKA. Patient does not have a PCP or insurance. TOC spoke with her daughter at the bedside. Agreeable to [facility]. Handout provided. Referral made.              Expected Discharge Plan: Home/Self Care Barriers to Discharge: Continued Medical Work up  Patient Goals and CMS Choice Patient states their goals for this hospitalization and ongoing recovery are:: to go home. CMS Medicare.gov Compare Post Acute Care list provided to:: Patient Represenative (must comment) Choice offered to / list presented to : Adult Children  Expected Discharge Plan and Services Expected Discharge Plan: Home/Self Care      Living arrangements for the past 2 months: Single Family Home                   Prior Living Arrangements/Services Living arrangements for the past 2 months: Single Family Home Lives with:: Spouse     Activities of Daily Living Home Assistive Devices/Equipment: None ADL Screening (condition at time of admission) Patient's cognitive ability adequate to safely complete daily activities?: Yes Is the patient deaf or have difficulty hearing?: No Does the patient have difficulty seeing, even when wearing glasses/contacts?: No Does the patient have difficulty concentrating, remembering, or making decisions?: No Patient able to express need for assistance with ADLs?: Yes Does the patient have difficulty dressing or bathing?: No Independently performs ADLs?: Yes (appropriate for developmental age) Does the patient have difficulty walking or climbing stairs?: No Weakness of Legs: None Weakness of Arms/Hands: None  Permission Sought/Granted        Emotional Assessment     Admission diagnosis:  DKA  (diabetic ketoacidosis) (HCC) [E11.10] Pain of upper abdomen [R10.10] Diabetic ketoacidosis without coma associated with type 1 diabetes mellitus (HCC) [E10.10] Acute pancreatitis, unspecified complication status, unspecified pancreatitis type [K85.90] Patient Active Problem List   Diagnosis Date Noted   Hypothyroidism 02/04/2022   Hypertriglyceridemia 02/02/2022   DKA (diabetic ketoacidosis) (HCC) 02/01/2022   Acute pancreatitis 02/01/2022   Acute appendicitis 03/25/2018   PCP:  Patient, No Pcp Per Pharmacy:   [facility] [address] [place] [address] [place] [address] Phone: [phone] Fax: [phone]   Readmission Risk Interventions    02/05/2022    1:46 PM  Readmission Risk Prevention Plan  Post Dischage Appt Complete  Medication Screening Complete  Transportation Screening Complete

## 2022-02-05 NOTE — Progress Notes (Signed)
PROGRESS NOTE    ANAM BOBBY  KXF:[id number] DOB: 09/25/1975 DOA: 02/01/2022 PCP: Patient, No Pcp Per   Brief Narrative:  46 year old female with no significant past medical history, admitted to the hospital with abdominal pain.  Found to have acute pancreatitis likely precipitated by hypertriglyceridemia.  She was also noted to be in diabetic ketoacidosis with new onset diabetes.  Admitted for IV fluids and IV insulin.  Over several days her insulin drip was continued, triglyceride level started slowly trending downwards.  Her hemoglobin A1c was noted to be 13.0.     Assessment & Plan:  Principal Problem:   DKA (diabetic ketoacidosis) (HCC) Active Problems:   Acute pancreatitis   Hypertriglyceridemia   Hypothyroidism    Acute pancreatitis likely induced by hypertriglyceridemia -Denies any alcohol use.  No gallstones noted on the CT scan.  Has elevated triglyceride levels which were significantly elevated initially requiring insulin drip now has been transitioned to subcu insulin due to uncontrolled diabetes mellitus type 2. - Still having abdominal pain, clear liquid diet.  Advance as tolerated.  Pain control.  Diabetic ketoacidosis, resolved Uncontrolled diabetes mellitus type 2 - Hemoglobin A1c 13.  Anion gap is closed.  Now Semglee and sliding scale.  Seen by diabetic coordinator.  Currently controlled but as her appetite increases her insulin will need to be adjusted.    Hypothyroidism - TSH 19.  Normal free T4 levels.   Hypokalemia -Replace       DVT prophylaxis: enoxaparin (LOVENOX) injection 40 mg Start: 02/01/22 1200 Code Status: Full code Family Communication: Husband and daughters at bedside  Patient is still having significant mild abdominal pain with poor oral intake.  Keep her in the hospital until abdominal pain is improved and she is able to consistently have oral intake.  Subjective: Patient reports significant amount of abdominal pain and  distention.  Denies any nausea or vomiting.  Initially told me she wants to go home but due to significant pain she is willing to stay in the hospital. He is also requesting me to advance her diet but she understand why we cant.   Physical exam: Constitutional: Not in acute distress Respiratory: Clear to auscultation bilaterally Cardiovascular: Normal sinus rhythm, no rubs Abdomen: tender to minimal palpation in the mid abd Musculoskeletal: No edema noted Skin: No rashes seen Neurologic: CN 2-12 grossly intact.  And nonfocal Psychiatric: Normal judgment and insight. Alert and oriented x 3. Normal mood.    Objective: Vitals:   02/05/22 0300 02/05/22 0500 02/05/22 0737 02/05/22 1107  BP: 119/83 125/87    Pulse: 79 80    Resp: 19 (!) 22    Temp: 97.8 F (36.6 C)  (!) 97.3 F (36.3 C) 98.5 F (36.9 C)  TempSrc: Axillary  Axillary Oral  SpO2: 98% 96%    Weight:      Height:        Intake/Output Summary (Last 24 hours) at 02/05/2022 1218 Last data filed at 02/05/2022 0538 Gross per 24 hour  Intake 1665.23 ml  Output 800 ml  Net 865.23 ml   Filed Weights   02/01/22 0626 02/01/22 1105 02/04/22 0500  Weight: 77.1 kg 81 kg 81.1 kg     Data Reviewed:   CBC: Recent Labs  Lab 02/01/22 0646 02/02/22 0405 02/04/22 0352 02/05/22 0419  WBC 14.1* 7.6 10.1 9.3  NEUTROABS 12.7*  --   --   --   HGB 14.3 13.3 11.7* 11.8*  HCT 42.2 38.9 36.6 37.2  MCV 82.1 82.6  86.1 86.1  PLT 290 222 249 287   Basic Metabolic Panel: Recent Labs  Lab 02/01/22 2338 02/02/22 0405 02/03/22 0337 02/04/22 0352 02/05/22 0419  NA 130* 131* 135 135 134*  K 3.6 3.4* 3.1* 4.3 3.5  CL 105 104 106 105 103  CO2 20* 20* 24 25 24   GLUCOSE 180* 215* 63* 83 152*  BUN <5* <5* <5* <5* 5*  CREATININE <0.30* 0.33* <0.30* 0.41* 0.46  CALCIUM 8.0* 8.2* 8.7* 8.9 8.6*  MG  --   --  1.9  --   --    GFR: Estimated Creatinine Clearance: 93.4 mL/min (by C-G formula based on SCr of 0.46 mg/dL). Liver Function  Tests: Recent Labs  Lab 02/01/22 0646  AST 21  ALT 31  ALKPHOS 144*  BILITOT 1.2  PROT 8.0  ALBUMIN 4.6   Recent Labs  Lab 02/01/22 0646  LIPASE 99*   No results for input(s): "AMMONIA" in the last 168 hours. Coagulation Profile: No results for input(s): "INR", "PROTIME" in the last 168 hours. Cardiac Enzymes: No results for input(s): "CKTOTAL", "CKMB", "CKMBINDEX", "TROPONINI" in the last 168 hours. BNP (last 3 results) No results for input(s): "PROBNP" in the last 8760 hours. HbA1C: No results for input(s): "HGBA1C" in the last 72 hours. CBG: Recent Labs  Lab 02/05/22 0004 02/05/22 0300 02/05/22 0532 02/05/22 0739 02/05/22 1106  GLUCAP 160* 154* 152* 152* 179*   Lipid Profile: Recent Labs    02/04/22 1721 02/05/22 0419  TRIG 570* 481*   Thyroid Function Tests: Recent Labs    02/04/22 0352 02/04/22 1003  TSH 19.068*  --   FREET4  --  0.69   Anemia Panel: No results for input(s): "VITAMINB12", "FOLATE", "FERRITIN", "TIBC", "IRON", "RETICCTPCT" in the last 72 hours. Sepsis Labs: No results for input(s): "PROCALCITON", "LATICACIDVEN" in the last 168 hours.  Recent Results (from the past 240 hour(s))  MRSA Next Gen by PCR, Nasal     Status: None   Collection Time: 02/01/22 11:10 AM   Specimen: Nasal Mucosa; Nasal Swab  Result Value Ref Range Status   MRSA by PCR Next Gen NOT DETECTED NOT DETECTED Final    Comment: (NOTE) The GeneXpert MRSA Assay (FDA approved for NASAL specimens only), is one component of a comprehensive MRSA colonization surveillance program. It is not intended to diagnose MRSA infection nor to guide or monitor treatment for MRSA infections. Test performance is not FDA approved in patients less than 52 years old. Performed at [facility], [address], [place], [place]          Radiology Studies: No results found.      Scheduled Meds:  Chlorhexidine Gluconate Cloth  6 each Topical Daily   enoxaparin  (LOVENOX) injection  40 mg Subcutaneous Q24H   gemfibrozil  600 mg Oral BID AC   insulin aspart  0-15 Units Subcutaneous Q4H   insulin glargine-yfgn  10 Units Subcutaneous Daily   oxyCODONE  5 mg Oral Q6H   pantoprazole (PROTONIX) IV  40 mg Intravenous Q24H   Continuous Infusions:  lactated ringers 100 mL/hr at 02/05/22 0536     LOS: 4 days   Time spent= 35 mins    Doralene Glanz 04/08/22, MD [facility]  If 7PM-7AM, please contact night-coverage  02/05/2022, 12:18 PM

## 2022-02-05 NOTE — Progress Notes (Signed)
Reporting of severe headache, facial pain and chest pain.  EKG shows NSR. Will check trops. CT head ordered as well.   Meoshia Billing

## 2022-02-05 NOTE — Progress Notes (Signed)
Pt reports SHOB at this time, Resps fluctuating btwn 22-24, O2 sat 99% on room air. O2 at 2lpm via [place] applied. Will continue to monitor.

## 2022-02-05 NOTE — Progress Notes (Signed)
Pt ambulated around unit. She tolerated well. She does report some generalized weakness. Rita Charles

## 2022-02-05 NOTE — Progress Notes (Signed)
Pt woke up this morning with severe headache.  Glucose checked, tylenol and ice given for comfort.  Symptoms appeared to resolve briefly, then woke patient back up with symptoms worse than prior, with headache all around head.  Pt tearful, restless.  States no visual changes.  Glucose checked, was 152.  Pt given IV dilaudid due to level of pain with quick resolution of symptoms.  Pt currently resting comfortably, pressure stable at 125/87.  HR 64.  Placed on 2L for comfort while at rest, sats 97%.  Message sent to Dr. Kerry Hough as an fyi for follow up today.

## 2022-02-06 DIAGNOSIS — E039 Hypothyroidism, unspecified: Secondary | ICD-10-CM

## 2022-02-06 LAB — CBC
HCT: 37.1 % (ref 36.0–46.0)
Hemoglobin: 11.6 g/dL — ABNORMAL LOW (ref 12.0–15.0)
MCH: 27.2 pg (ref 26.0–34.0)
MCHC: 31.3 g/dL (ref 30.0–36.0)
MCV: 86.9 fL (ref 80.0–100.0)
Platelets: 269 10*3/uL (ref 150–400)
RBC: 4.27 MIL/uL (ref 3.87–5.11)
RDW: 16.1 % — ABNORMAL HIGH (ref 11.5–15.5)
WBC: 10.5 10*3/uL (ref 4.0–10.5)
nRBC: 0 % (ref 0.0–0.2)

## 2022-02-06 LAB — TRIGLYCERIDES: Triglycerides: 465 mg/dL — ABNORMAL HIGH (ref <150)

## 2022-02-06 LAB — COMPREHENSIVE METABOLIC PANEL
ALT: 15 U/L (ref 0–44)
AST: 12 U/L — ABNORMAL LOW (ref 15–41)
Albumin: 2.8 g/dL — ABNORMAL LOW (ref 3.5–5.0)
Alkaline Phosphatase: 97 U/L (ref 38–126)
Anion gap: 8 (ref 5–15)
BUN: 5 mg/dL — ABNORMAL LOW (ref 6–20)
CO2: 24 mmol/L (ref 22–32)
Calcium: 8.6 mg/dL — ABNORMAL LOW (ref 8.9–10.3)
Chloride: 102 mmol/L (ref 98–111)
Creatinine, Ser: 0.48 mg/dL (ref 0.44–1.00)
GFR, Estimated: >60 mL/min (ref >60)
Glucose, Bld: 162 mg/dL — ABNORMAL HIGH (ref 70–99)
Potassium: 3.6 mmol/L (ref 3.5–5.1)
Sodium: 134 mmol/L — ABNORMAL LOW (ref 135–145)
Total Bilirubin: 0.9 mg/dL (ref 0.3–1.2)
Total Protein: 6.1 g/dL — ABNORMAL LOW (ref 6.5–8.1)

## 2022-02-06 LAB — GLUCOSE, CAPILLARY
Glucose-Capillary: 167 mg/dL — ABNORMAL HIGH (ref 70–99)
Glucose-Capillary: 173 mg/dL — ABNORMAL HIGH (ref 70–99)
Glucose-Capillary: 141 mg/dL — ABNORMAL HIGH (ref 70–99)

## 2022-02-06 LAB — LIPASE, BLOOD: Lipase: 44 U/L (ref 11–51)

## 2022-02-06 LAB — MAGNESIUM: Magnesium: 1.8 mg/dL (ref 1.7–2.4)

## 2022-02-06 MED ORDER — SENNOSIDES-DOCUSATE SODIUM 8.6-50 MG PO TABS: 1.0000 | ORAL_TABLET | Freq: Every evening | ORAL | 0 refills | Status: DC | PRN

## 2022-02-06 MED ORDER — INSULIN STARTER KIT- PEN NEEDLES (ENGLISH): 1.0000 | Freq: Once | 0 refills | Status: AC

## 2022-02-06 MED ORDER — ACETAMINOPHEN 325 MG PO TABS: 650.0000 mg | ORAL_TABLET | Freq: Four times a day (QID) | ORAL | 0 refills | Status: AC | PRN

## 2022-02-06 MED ORDER — OXYCODONE HCL 5 MG PO TABS: 5.0000 mg | ORAL_TABLET | Freq: Four times a day (QID) | ORAL | 0 refills | Status: AC | PRN

## 2022-02-06 MED ORDER — INSULIN STARTER KIT- PEN NEEDLES (ENGLISH)
1.0000 | Freq: Once | Status: DC
  Filled 2022-02-06: qty 1

## 2022-02-06 MED ORDER — GEMFIBROZIL 600 MG PO TABS: 600.0000 mg | ORAL_TABLET | Freq: Two times a day (BID) | ORAL | 1 refills | Status: AC

## 2022-02-06 MED ORDER — NOVOLIN 70/30 FLEXPEN (70-30) 100 UNIT/ML ~~LOC~~ SUPN: 15.0000 [IU] | PEN_INJECTOR | Freq: Two times a day (BID) | SUBCUTANEOUS | 11 refills | Status: AC

## 2022-02-06 MED ORDER — INSULIN ASPART PROT & ASPART (70-30 MIX) 100 UNIT/ML ~~LOC~~ SUSP
15.0000 [IU] | Freq: Two times a day (BID) | SUBCUTANEOUS | Status: DC
  Filled 2022-02-06: qty 10

## 2022-02-06 NOTE — Progress Notes (Signed)
Pt noted resting quietly, no complaints of shortness of breath at this time. No distress noted.

## 2022-02-06 NOTE — Progress Notes (Addendum)
Diabetes Coordinator at bedside. Report given to [facility] [address] on 300 and will transport when Diabetes Edu is complete. Wardell Heath Gerrianne Scale

## 2022-02-06 NOTE — Discharge Summary (Signed)
Physician Discharge Summary   Patient: Rita Charles MRN: [id number] DOB: 07-02-1976  Admit date:     02/01/2022  Discharge date: 02/06/22  Discharge Physician: Deatra James   PCP: Patient, No Pcp Per   Recommendations at discharge:   To follow-up with the PCP within 1-2 weeks Continue to monitor blood sugars before meals, with a specific log for further adjustment of insulin 3.  Newly diagnosed diabetes mellitus type 2, hypertriglyceridemia 4.  New insulin regimen 70 3015 units twice daily    Discharge Diagnoses: Principal Problem:   DKA (diabetic ketoacidosis) ([place]) Active Problems:   Acute pancreatitis   Hypertriglyceridemia   Hypothyroidism  Resolved Problems:   * No resolved hospital problems. *  Hospital Course: Rita Charles  is a 46 year old female with no significant past medical history, admitted to the hospital with abdominal pain.  Found to have acute pancreatitis likely precipitated by hypertriglyceridemia.  She was also noted to be in diabetic ketoacidosis with new onset diabetes.  Admitted for IV fluids and IV insulin.  Over several days her insulin drip was continued, triglyceride level started slowly trending downwards.  Her hemoglobin A1c was noted to be 13.0.       Assessment & Plan:  Principal Problem:   DKA (diabetic ketoacidosis) ([place]) Active Problems:   Acute pancreatitis   Hypertriglyceridemia   Hypothyroidism     Acute pancreatitis likely induced by hypertriglyceridemia -Denies any alcohol use.  No gallstones noted on the CT scan.  Has elevated triglyceride levels which were significantly elevated initially requiring insulin drip now has been transitioned to subcu insulin due to uncontrolled diabetes mellitus type 2. - Tolerating diet, advanced accordingly   Diabetic ketoacidosis, resolved Uncontrolled diabetes mellitus type 2 - Hemoglobin A1c 13.  Anion gap is closed.  Now Semglee and sliding scale.   Seen by diabetic  coordinator... Per recommendation switch to NPH 7030, 15 units twice daily Currently controlled but as her appetite increases her insulin will need to be adjusted.     Hypothyroidism - TSH 19.  Normal free T4 levels.   Headaches : Improved with as needed analgesics, CT of the head negative   hypokalemia -Replace        Consultants: Patient/diabetic coordinator Procedures performed: none   Disposition: Home Diet recommendation:  Discharge Diet Orders (From admission, onward)     Start     Ordered   02/06/22 0000  Diet Carb Modified        02/06/22 1143           Carb modified diet DISCHARGE MEDICATION: Allergies as of 02/06/2022   No Known Allergies      Medication List     TAKE these medications    acetaminophen 325 MG tablet Commonly known as: TYLENOL Take 2 tablets (650 mg total) by mouth every 6 (six) hours as needed for mild pain.   gemfibrozil 600 MG tablet Commonly known as: LOPID Take 1 tablet (600 mg total) by mouth 2 (two) times daily before a meal.   insulin starter kit- pen needles Misc 1 kit by Other route once for 1 dose.   NovoLIN 70/30 Kwikpen (70-30) 100 UNIT/ML KwikPen Generic drug: insulin isophane & regular human KwikPen Inject 15 Units into the skin 2 (two) times daily.   oxyCODONE 5 MG immediate release tablet Commonly known as: Oxy IR/ROXICODONE Take 1 tablet (5 mg total) by mouth every 6 (six) hours as needed for up to 3 days for severe pain.  senna-docusate 8.6-50 MG tablet Commonly known as: Senokot-S Take 1 tablet by mouth at bedtime as needed for moderate constipation.               Durable Medical Equipment  (From admission, onward)           Start     Ordered   02/06/22 1130  DME Glucometer  Once        02/06/22 1129            Discharge Exam: Filed Weights   02/04/22 0500 02/06/22 0600 02/06/22 0937  Weight: 81.1 kg 81.2 kg 78.9 kg      Physical Exam:   General:  AAO x 3,   cooperative, no distress;   HEENT:  Normocephalic, PERRL, otherwise with in Normal limits   Neuro:  CNII-XII intact. , normal motor and sensation, reflexes intact   Lungs:   Clear to auscultation BL, Respirations unlabored,  No wheezes / crackles  Cardio:    S1/S2, RRR, No murmure, No Rubs or Gallops   Abdomen:  Soft, non-tender, bowel sounds active all four quadrants, no guarding or peritoneal signs.  Muscular  skeletal:  Limited exam -global generalized weaknesses - in bed, able to move all 4 extremities,   2+ pulses,  symmetric, No pitting edema  Skin:  Dry, warm to touch, negative for any Rashes,  Wounds: Please see nursing documentation          Condition at discharge: good  The results of significant diagnostics from this hospitalization (including imaging, microbiology, ancillary and laboratory) are listed below for reference.   Imaging Studies: CT HEAD WO CONTRAST (5MM)  Result Date: 02/05/2022 CLINICAL DATA:  Headaches EXAM: CT HEAD WITHOUT CONTRAST TECHNIQUE: Contiguous axial images were obtained from the base of the skull through the vertex without intravenous contrast. RADIATION DOSE REDUCTION: This exam was performed according to the departmental dose-optimization program which includes automated exposure control, adjustment of the mA and/or kV according to patient size and/or use of iterative reconstruction technique. COMPARISON:  None Available. FINDINGS: Brain: No acute intracranial findings are seen. There are no signs of bleeding within the cranium. Ventricles are not dilated. There is no focal edema or mass effect. Vascular: Unremarkable. Skull: Unremarkable. Sinuses/Orbits: There is mild mucosal thickening in frontal and ethmoid sinuses. Other: None. IMPRESSION: No acute intracranial findings are seen in noncontrast CT brain. Mild chronic sinusitis. Electronically Signed   By: Palani  Rathinasamy M.D.   On: 02/05/2022 18:36   CT ABDOMEN PELVIS W CONTRAST  Result  Date: 02/01/2022 CLINICAL DATA:  Abdominal pain. EXAM: CT ABDOMEN AND PELVIS WITH CONTRAST TECHNIQUE: Multidetector CT imaging of the abdomen and pelvis was performed using the standard protocol following bolus administration of intravenous contrast. RADIATION DOSE REDUCTION: This exam was performed according to the departmental dose-optimization program which includes automated exposure control, adjustment of the mA and/or kV according to patient size and/or use of iterative reconstruction technique. CONTRAST:  100mL OMNIPAQUE IOHEXOL 300 MG/ML  SOLN COMPARISON:  03/25/2018 FINDINGS: Lower chest: Subsegmental atelectasis noted in the lung bases. Hepatobiliary: No suspicious focal abnormality within the liver parenchyma. There is no evidence for gallstones, gallbladder wall thickening, or pericholecystic fluid. No intrahepatic or extrahepatic biliary dilation. Pancreas: There is edema in the parenchyma of the pancreatic body and tail with fairly substantial peripancreatic edema/inflammation around the body and tail of pancreas. No main duct dilatation. Pancreatic parenchyma enhances throughout. Spleen: No splenomegaly. No focal mass lesion. Adrenals/Urinary Tract: No adrenal nodule or mass.   Kidneys unremarkable. No evidence for hydroureter. The urinary bladder appears normal for the degree of distention. Stomach/Bowel: Stomach is unremarkable. No gastric wall thickening. No evidence of outlet obstruction. Duodenum is normally positioned as is the ligament of Treitz. No small bowel wall thickening. No small bowel dilatation. The terminal ileum is normal. Nonvisualization of the appendix is consistent with the reported history of appendectomy. No gross colonic mass. No colonic wall thickening. Vascular/Lymphatic: No abdominal aortic aneurysm. No abdominal aortic atherosclerotic calcification. Portal vein and splenic vein are patent. There is no gastrohepatic or hepatoduodenal ligament lymphadenopathy. No  retroperitoneal or mesenteric lymphadenopathy. No pelvic sidewall lymphadenopathy. Reproductive: The uterus is unremarkable.  There is no adnexal mass. Other: No intraperitoneal free fluid. Musculoskeletal: No worrisome lytic or sclerotic osseous abnormality. IMPRESSION: Edema in the parenchyma of the pancreatic body and tail with fairly substantial peripancreatic edema/inflammation. Imaging features consistent with acute pancreatitis. No evidence for pancreatic necrosis or pseudocyst. No vascular complication. No main duct dilatation. Electronically Signed   By: Misty Stanley M.D.   On: 02/01/2022 09:30    Microbiology: Results for orders placed or performed during the hospital encounter of 02/01/22  MRSA Next Gen by PCR, Nasal     Status: None   Collection Time: 02/01/22 11:10 AM   Specimen: Nasal Mucosa; Nasal Swab  Result Value Ref Range Status   MRSA by PCR Next Gen NOT DETECTED NOT DETECTED Final    Comment: (NOTE) The GeneXpert MRSA Assay (FDA approved for NASAL specimens only), is one component of a comprehensive MRSA colonization surveillance program. It is not intended to diagnose MRSA infection nor to guide or monitor treatment for MRSA infections. Test performance is not FDA approved in patients less than 63 years old. Performed at [facility], [address] [place], [place] [address]     Labs: CBC: Recent Labs  Lab 02/01/22 [phone] 02/02/22 0405 02/04/22 0352 02/05/22 0419 02/06/22 0424  WBC 14.1* 7.6 10.1 9.3 10.5  NEUTROABS 12.7*  --   --   --   --   HGB 14.3 13.3 11.7* 11.8* 11.6*  HCT 42.2 38.9 36.6 37.2 37.1  MCV 82.1 82.6 86.1 86.1 86.9  PLT 290 222 249 287 585   Basic Metabolic Panel: Recent Labs  Lab 02/02/22 0405 02/03/22 0337 02/04/22 0352 02/05/22 0419 02/06/22 0424  NA 131* 135 135 134* 134*  K 3.4* 3.1* 4.3 3.5 3.6  CL 104 106 105 103 102  CO2 20* _0 GLUCOSE 215* 63* 83 152* 162*  BUN <5* <5* <5* 5* 5*  CREATININE 0.33* <0.30* 0.41*  0.46 0.48  CALCIUM 8.2* 8.7* 8.9 8.6* 8.6*  MG  --  1.9  --   --  1.8   Liver Function Tests: Recent Labs  Lab 02/01/22 0646 02/06/22 0424  AST 21 12*  ALT 31 15  ALKPHOS 144* 97  BILITOT 1.2 0.9  PROT 8.0 6.1*  ALBUMIN 4.6 2.8*   CBG: Recent Labs  Lab 02/05/22 1949 02/05/22 2349 02/06/22 0426 02/06/22 0750 02/06/22 1115  GLUCAP 155* 139* 167* 173* 141*    Discharge time spent: greater than 30 minutes.  Signed: Deatra James, MD [facility] 02/06/2022

## 2022-02-06 NOTE — Progress Notes (Signed)
Pt rested fairly well throughout the night. Awakening with reports of headache, requesting dilaudid at those times, with relief noted. Continues to report ongoing abdominal pain, however headache supercedes abdominal pain. Refused all doses of oxycodone, except hs dose. Pt reports no pain this morning with rounding prior to shift change. Denies chest pain this shift.

## 2022-02-06 NOTE — Inpatient Diabetes Management (Signed)
Inpatient Diabetes Program Recommendations  AACE/ADA: New Consensus Statement on Inpatient Glycemic Control (2015)  Target Ranges:  Prepandial:   less than 140 mg/dL      Peak postprandial:   less than 180 mg/dL (1-2 hours)      Critically ill patients:  140 - 180 mg/dL   Lab Results  Component Value Date   GLUCAP 173 (H) 02/06/2022   HGBA1C 13.0 (H) 02/01/2022    Latest Reference Range & Units 02/06/22 04:24  Triglycerides <150 mg/dL 465 (H)  (H): Data is abnormally high  Latest Reference Range & Units 02/05/22 07:39 02/05/22 11:06 02/05/22 16:31 02/05/22 19:49 02/05/22 23:49 02/06/22 04:26 02/06/22 07:50  Glucose-Capillary 70 - 99 mg/dL 152 (H) 179 (H) 151 (H) 155 (H) 139 (H) 167 (H) 173 (H)  (H): Data is abnormally high  Review of Glycemic Control  Diabetes history: New Onset Current orders for Inpatient glycemic control: Semglee 10 units, Novolog 0-15 units q 4 hrs. correction  Inpatient Diabetes Program Recommendations:   Spoke with pt, husband, and two daughters @ bedside about new diagnosis. Discussed A1C results with them and explained what an A1C is, basic pathophysiology of DM Type 2, basic home care, basic diabetes diet nutrition principles, importance of checking CBGs and maintaining good CBG control to prevent long-term and short-term complications. Reviewed signs and symptoms of hyperglycemia and hypoglycemia and how to treat hypoglycemia at home. Also reviewed blood sugar goals at home.  RNs to provide ongoing basic DM education at bedside with this patient. Have ordered educational booklet and insulin starter kit.  Educated patient, spouse , and daughters on insulin pen use at home. Reviewed contents of insulin flexpen starter kit. Reviewed all steps if insulin pen including attachment of needle, 2-unit air shot, dialing up dose, giving injection, removing needle, disposal of sharps, storage of unused insulin, disposal of insulin etc. Patient able to provide successful  return demonstration. Also reviewed troubleshooting with insulin pen. MD to give patient Rxs for insulin pens and insulin pen needles.   Patient has been drinking high amount of sugary drinks including pepsi and tea. Also eats high amount of high carbohydrate foods. Discussed nutrition @ length and patient willing to make better choices in eating and drinking.  When eating, consider change to 70/30 insulin bid to prepare for discharge home with affordable insulin.  Thank you, Rita Charles. Rita Morand, RN, MSN, CDE  Diabetes Coordinator Inpatient Glycemic Control Team Team Pager [phone] (8am-5pm) 02/06/2022 9:31 AM

## 2022-02-10 ENCOUNTER — Telehealth: Payer: Self-pay

## 2022-02-10 NOTE — Telephone Encounter (Signed)
Referral from [facility], discharged on 02/06/22. Talked with Sarajane Marek about [facility] and documents needed to enroll. Appointment for enrollment scheduled for 02/13/22 at 2PM at [facility] office. Address provided and main line number given for [facility].   Francee Nodal RN [employer]

## 2022-02-24 ENCOUNTER — Ambulatory Visit: Payer: Self-pay | Admitting: Physician Assistant

## 2022-02-24 ENCOUNTER — Encounter: Payer: Self-pay | Admitting: Physician Assistant

## 2022-02-24 VITALS — BP 113/79 | HR 62 | Temp 97.3°F

## 2022-02-24 DIAGNOSIS — Z5320 Procedure and treatment not carried out because of patient's decision for unspecified reasons: Secondary | ICD-10-CM

## 2022-02-24 DIAGNOSIS — E1165 Type 2 diabetes mellitus with hyperglycemia: Secondary | ICD-10-CM

## 2022-02-24 DIAGNOSIS — Z7689 Persons encountering health services in other specified circumstances: Secondary | ICD-10-CM

## 2022-02-24 DIAGNOSIS — E039 Hypothyroidism, unspecified: Secondary | ICD-10-CM

## 2022-02-24 DIAGNOSIS — E781 Pure hyperglyceridemia: Secondary | ICD-10-CM

## 2022-02-24 MED ORDER — LEVOTHYROXINE SODIUM 25 MCG PO TABS
25.0000 ug | ORAL_TABLET | Freq: Every day | ORAL | 1 refills | Status: AC
Start: 2022-02-24 — End: ?

## 2022-02-24 NOTE — Progress Notes (Signed)
BP 113/79   Pulse 62   Temp (!) 97.3 F (36.3 C)   SpO2 98%    Subjective:    Patient ID: Rita Charles, female    DOB: 1975/08/23, 46 y.o.   MRN: [id number]  HPI: Rita Charles is a 46 y.o. female presenting on 02/24/2022 for New Patient (Initial Visit)   HPI   Chief Complaint  Patient presents with   New Patient (Initial Visit)    Pt is 46yoF who presents to establish care.  She says she had a physical about a year ago at [facility]- she got a mammogram and a PAP smear at that time.   She says she was told her bs was high at that time but she says she didn't believe it so she didn't do anyting about it.    Pt complains that her vision is bad.   She says she has never been to get her eyes examined.    She stopped smoking age 2.   Her contraception method is her husband pulls out.    She does not work.   Her youngest child is 16yo.    Pt says she has been monitoring her bs and FBS yesterday 119  Pt says she never got covid vaccination.   Pt has FMLA forms that she wants signed so her husband won't lose his job.  She becomes tearful and cries that she doesn't want him to get fired.  Apparently he has not been to work since pt was first hospitalized on 02/01/22.     Relevant past medical, surgical, family and social history reviewed and updated as indicated. Interim medical history since our last visit reviewed. Allergies and medications reviewed and updated.    Current Outpatient Medications:    acetaminophen (TYLENOL) 325 MG tablet, Take 2 tablets (650 mg total) by mouth every 6 (six) hours as needed for mild pain., Disp: 30 tablet, Rfl: 0   gemfibrozil (LOPID) 600 MG tablet, Take 1 tablet (600 mg total) by mouth 2 (two) times daily before a meal., Disp: 60 tablet, Rfl: 1   insulin isophane & regular human KwikPen (NOVOLIN 70/30 KWIKPEN) (70-30) 100 UNIT/ML KwikPen, Inject 15 Units into the skin 2 (two) times daily., Disp: 15 mL, Rfl: 11    Review of  Systems  Per HPI unless specifically indicated above     Objective:    BP 113/79   Pulse 62   Temp (!) 97.3 F (36.3 C)   SpO2 98%   Wt Readings from Last 3 Encounters:  02/06/22 173 lb 15.1 oz (78.9 kg)  02/11/21 184 lb (83.5 kg)  05/24/18 174 lb (78.9 kg)    Physical Exam Vitals reviewed.  Constitutional:      General: She is not in acute distress.    Appearance: Normal appearance. She is well-developed. She is not ill-appearing or toxic-appearing.  HENT:     Head: Normocephalic and atraumatic.     Right Ear: Tympanic membrane, ear canal and external ear normal.     Left Ear: Tympanic membrane, ear canal and external ear normal.     Mouth/Throat:     Pharynx: No oropharyngeal exudate.  Eyes:     Extraocular Movements: Extraocular movements intact.     Conjunctiva/sclera: Conjunctivae normal.     Pupils: Pupils are equal, round, and reactive to light.  Neck:     Thyroid: No thyromegaly.  Cardiovascular:     Rate and Rhythm: Normal rate and regular rhythm.  Pulmonary:  Effort: Pulmonary effort is normal.     Breath sounds: Normal breath sounds.  Abdominal:     General: Bowel sounds are normal.     Palpations: Abdomen is soft. There is no hepatomegaly, splenomegaly or mass.     Tenderness: There is no abdominal tenderness.     Comments: Exam limited by pt wearing a girdle  Musculoskeletal:     Cervical back: Neck supple.     Right lower leg: No edema.     Left lower leg: No edema.  Lymphadenopathy:     Cervical: No cervical adenopathy.  Skin:    General: Skin is warm and dry.  Neurological:     Mental Status: She is alert and oriented to person, place, and time.     Motor: Motor function is intact. No weakness or tremor.     Gait: Gait is intact. Gait normal.  Psychiatric:        Attention and Perception: Attention normal.        Mood and Affect: Mood is anxious.        Speech: Speech normal.        Behavior: Behavior normal. Behavior is cooperative.             Assessment & Plan:    Encounter Diagnoses  Name Primary?   Encounter to establish care Yes   Uncontrolled type 2 diabetes mellitus with hyperglycemia (HCC)    Hypertriglyceridemia    Hypothyroidism, unspecified type    Screening mammography declined       -Pt declines mammogram -Pt was told she should not be driving if she can't see.  She will be referred for diabetic eye exam -Labs in hospital reviewed-   TSH 19..   Add low-dose levothyroxine -pt to Continue current iinsulin and gemfibrolzil    -pt to Monitor bs- pt was given bs log  -just prior to end of appointment, it was discovered that pt has insurance.  Discussed that she is no longer eligible at [facility] and she will need to establish with new PCP  -discussed with pt that unable to complete FMLA papers to go through 03/06/22 as they are filled out.  Will review papers and EHR but likely will only be able to note that pt was in hospital.  Discussed with pt that so far, no evidence that her husband should need to be out of work after pt was discharged from hospital.  Pt and her husband both request that paperwork be completed as much as possible.  Will Review of chart in determination of FMLA papers  -Pt demonstrated to diabetic nurse educator ability to  to use her insulin pen.   -TOC note while inpatient-   Does the patient have difficulty dressing or bathing?: No Independently performs ADLs?: Yes (appropriate for developmental age) Does the patient have difficulty walking or climbing stairs?: No Weakness of Legs: None Weakness of Arms/Hands: None

## 2022-02-24 NOTE — Patient Instructions (Signed)

## 2022-02-27 ENCOUNTER — Telehealth: Payer: Self-pay

## 2022-02-27 NOTE — Telephone Encounter (Signed)
Called client to let her know that was able to confirm the insurance with [employer] has been cancelled and confirmed with [employer], copy sent securely to Larey Dresser at [facility] and she will contact client to make a new appointment. Client notified of above, while on phone [facility] was calling her, urged her to take the call. She states she will let me know how that turns out.   Francee Nodal RN [employer]

## 2022-02-28 ENCOUNTER — Telehealth: Payer: Self-pay

## 2022-02-28 NOTE — Telephone Encounter (Signed)
Client had left a vm on the care connect main line at closing, called today to return call. No answer, left message requesting return call.   Francee Nodal RN [employer]
